# Patient Record
Sex: Female | Born: 1985 | Race: White | Hispanic: No | Smoking: Current every day smoker
Health system: Southern US, Community
[De-identification: ages and names within clinical notes are randomized; demographics above are authoritative.]

## PROBLEM LIST (undated history)

## (undated) DIAGNOSIS — I10 Essential (primary) hypertension: Secondary | ICD-10-CM

## (undated) DIAGNOSIS — R569 Unspecified convulsions: Secondary | ICD-10-CM

## (undated) DIAGNOSIS — F419 Anxiety disorder, unspecified: Secondary | ICD-10-CM

## (undated) DIAGNOSIS — F32A Depression, unspecified: Secondary | ICD-10-CM

## (undated) DIAGNOSIS — Z95818 Presence of other cardiac implants and grafts: Secondary | ICD-10-CM

## (undated) HISTORY — DX: Essential (primary) hypertension: I10

## (undated) HISTORY — PX: APPENDECTOMY: SHX54

---

## 2012-01-17 HISTORY — PX: TENDON REPAIR: SHX5111

## 2017-12-21 DIAGNOSIS — F419 Anxiety disorder, unspecified: Secondary | ICD-10-CM | POA: Insufficient documentation

## 2018-03-20 ENCOUNTER — Encounter (HOSPITAL_COMMUNITY): Payer: Self-pay | Admitting: Emergency Medicine

## 2018-03-20 ENCOUNTER — Emergency Department (HOSPITAL_COMMUNITY)
Admission: EM | Admit: 2018-03-20 | Discharge: 2018-03-21 | Disposition: A | Discharge status: Home / Self Care | Payer: BLUE CROSS/BLUE SHIELD | Attending: Emergency Medicine | Admitting: Emergency Medicine

## 2018-03-20 ENCOUNTER — Other Ambulatory Visit: Payer: Self-pay

## 2018-03-20 ENCOUNTER — Emergency Department (HOSPITAL_COMMUNITY): Payer: BLUE CROSS/BLUE SHIELD

## 2018-03-20 DIAGNOSIS — F17200 Nicotine dependence, unspecified, uncomplicated: Secondary | ICD-10-CM | POA: Insufficient documentation

## 2018-03-20 DIAGNOSIS — Y998 Other external cause status: Secondary | ICD-10-CM | POA: Diagnosis not present

## 2018-03-20 DIAGNOSIS — Y929 Unspecified place or not applicable: Secondary | ICD-10-CM | POA: Diagnosis not present

## 2018-03-20 DIAGNOSIS — S63501A Unspecified sprain of right wrist, initial encounter: Secondary | ICD-10-CM

## 2018-03-20 DIAGNOSIS — Y9389 Activity, other specified: Secondary | ICD-10-CM | POA: Diagnosis not present

## 2018-03-20 DIAGNOSIS — W228XXA Striking against or struck by other objects, initial encounter: Secondary | ICD-10-CM | POA: Diagnosis not present

## 2018-03-20 DIAGNOSIS — S6991XA Unspecified injury of right wrist, hand and finger(s), initial encounter: Secondary | ICD-10-CM | POA: Diagnosis present

## 2018-03-20 NOTE — ED Triage Notes (Signed)
Pt c/o right wrist pain after hitting it with a 2x4 yesterday. Bruising noted.

## 2018-03-21 MED ORDER — ACETAMINOPHEN 500 MG PO TABS: 1000.0000 mg | ORAL_TABLET | Freq: Three times a day (TID) | ORAL | 0 refills | Status: DC | PRN

## 2018-03-21 MED ORDER — MELOXICAM 15 MG PO TABS: 15.0000 mg | ORAL_TABLET | Freq: Every day | ORAL | 0 refills | Status: DC

## 2018-03-21 NOTE — ED Provider Notes (Signed)
[facility] EMERGENCY DEPARTMENT Provider Note   CSN: [id number] Arrival date & time: 03/20/18  2035    History   Chief Complaint Chief Complaint  Patient presents with  . Wrist Pain    HPI Jane Tran is a 33 y.o. female who presents with right wrist injury after she hit a 2 x 4 with her fist.  She reports pain on the ulnar aspect of her wrist.  She has had associated swelling.  She reports she did not have much pain when it first happened, but want to shift gears today and felt a lot of pain when she moved it.  Patient reports she has baseline numbness and tingling to her third through fifth digits on her hands that is being worked up by neurology.  This is a little worse in her pinky finger since the injury.  She did not take any medications prior to arrival.  She denies any other injuries.     HPI  History reviewed. No pertinent past medical history.  There are no active problems to display for this patient.   History reviewed. No pertinent surgical history.   OB History   No obstetric history on file.      Home Medications    Prior to Admission medications   Medication Sig Start Date End Date Taking? Authorizing Provider  acetaminophen (TYLENOL) 500 MG tablet Take 2 tablets (1,000 mg total) by mouth every 8 (eight) hours as needed for moderate pain. 03/21/18   Scarlet Abad, Waylan Boga, [name]  meloxicam (MOBIC) 15 MG tablet Take 1 tablet (15 mg total) by mouth daily. 03/21/18   [name], [name]    Family History No family history on file.  Social History Social History   Tobacco Use  . Smoking status: Current Every Day Smoker  . Smokeless tobacco: Never Used  Substance Use Topics  . Alcohol use: Yes  . Drug use: Never     Allergies   Patient has no known allergies.   Review of Systems Review of Systems  Musculoskeletal: Positive for arthralgias and joint swelling.  Neurological: Positive for numbness.     Physical Exam Updated  Vital Signs BP 118/75 (BP Location: Right Arm)   Pulse 77   Temp 98.6 F (37 C) (Oral)   Resp 18   LMP 03/20/2018   SpO2 100%   Physical Exam Vitals signs and nursing note reviewed.  Constitutional:      General: She is not in acute distress.    Appearance: She is well-developed. She is not diaphoretic.  HENT:     Head: Normocephalic and atraumatic.     Mouth/Throat:     Pharynx: No oropharyngeal exudate.  Eyes:     General: No scleral icterus.       Right eye: No discharge.        Left eye: No discharge.     Conjunctiva/sclera: Conjunctivae normal.     Pupils: Pupils are equal, round, and reactive to light.  Neck:     Musculoskeletal: Normal range of motion and neck supple.     Thyroid: No thyromegaly.  Cardiovascular:     Rate and Rhythm: Normal rate and regular rhythm.     Heart sounds: Normal heart sounds. No murmur. No friction rub. No gallop.   Pulmonary:     Effort: Pulmonary effort is normal. No respiratory distress.     Breath sounds: Normal breath sounds. No stridor. No wheezing or rales.  Abdominal:  Palpations: Abdomen is soft.  Musculoskeletal:     Comments: Right wrist: Tenderness and mild swelling over distal ulnar aspect of the wrist, no significant ecchymosis noted, no anatomical snuffbox tenderness, no tenderness to be hand or fingers; decreased sensation to the third through fifth digits, but pressure intact, cap refill less than 2 seconds, radial pulse intact  Lymphadenopathy:     Cervical: No cervical adenopathy.  Skin:    General: Skin is warm and dry.     Coloration: Skin is not pale.     Findings: No rash.  Neurological:     Mental Status: She is alert.     Coordination: Coordination normal.      ED Treatments / Results  Labs (all labs ordered are listed, but only abnormal results are displayed) Labs Reviewed - No data to display  EKG None  Radiology Dg Wrist Complete Right  Result Date: 03/20/2018 CLINICAL DATA:  Wrist injury  EXAM: RIGHT WRIST - COMPLETE 3+ VIEW COMPARISON:  None. FINDINGS: There is no evidence of fracture or dislocation. There is no evidence of arthropathy or other focal bone abnormality. Soft tissues are unremarkable. IMPRESSION: Negative. Electronically Signed   By: Jasmine Pang M.D.   On: 03/20/2018 21:36    Procedures Procedures (including critical care time)  Medications Ordered in ED Medications - No data to display   Initial Impression / Assessment and Plan / ED Course  I have reviewed the triage vital signs and the nursing notes.  Pertinent labs & imaging results that were available during my care of the patient were reviewed by me and considered in my medical decision making (see chart for details).        Patient presenting with probable sprain of the right wrist.  X-ray is negative.  There is no anatomical snuffbox tenderness.  Patient will be given Mobic, Tylenol, rest brace, and ice recommended.  Follow-up to PCP as needed.  Continue follow-up with neurology for ongoing numbness and tingling.  Return precautions discussed.  Patient understands and agrees with plan.  Patient vitals stable throughout ED course and discharged in satisfactory condition.  Final Clinical Impressions(s) / ED Diagnoses   Final diagnoses:  Sprain of right wrist, initial encounter    ED Discharge Orders         Ordered    meloxicam (MOBIC) 15 MG tablet  Daily     03/21/18 0050    acetaminophen (TYLENOL) 500 MG tablet  Every 8 hours PRN     03/21/18 0050           [name], [name] 03/21/18 0055    Dione Booze, MD 03/21/18 [phone]

## 2018-03-21 NOTE — Discharge Instructions (Signed)
Take Mobic once daily for your pain.  You can alternate with Tylenol as prescribed, as needed for your pain.  Use ice 3-4 times daily alternating 20 minutes on, 20 minutes off.  Wear brace for support, as needed.  Please follow-up with your doctor if symptoms are not improving over the next week.  Please return the emergency department if you develop any new or worsening symptoms including worsening numbness, color change, or any other concerning symptoms.

## 2018-03-21 NOTE — ED Notes (Signed)
Pt a one touch pt, see Provider's assessment.

## 2019-06-07 ENCOUNTER — Emergency Department (HOSPITAL_COMMUNITY)
Admission: EM | Admit: 2019-06-07 | Discharge: 2019-06-07 | Disposition: A | Discharge status: Home / Self Care | Payer: BLUE CROSS/BLUE SHIELD | Attending: Emergency Medicine | Admitting: Emergency Medicine

## 2019-06-07 ENCOUNTER — Other Ambulatory Visit: Payer: Self-pay

## 2019-06-07 DIAGNOSIS — M542 Cervicalgia: Secondary | ICD-10-CM | POA: Insufficient documentation

## 2019-06-07 DIAGNOSIS — Z79899 Other long term (current) drug therapy: Secondary | ICD-10-CM | POA: Insufficient documentation

## 2019-06-07 DIAGNOSIS — F17210 Nicotine dependence, cigarettes, uncomplicated: Secondary | ICD-10-CM | POA: Insufficient documentation

## 2019-06-07 MED ORDER — NAPROXEN 500 MG PO TABS: 500.0000 mg | ORAL_TABLET | Freq: Two times a day (BID) | ORAL | 0 refills | Status: DC

## 2019-06-07 MED ORDER — LIDOCAINE 5 % EX PTCH: 1.0000 | MEDICATED_PATCH | CUTANEOUS | 0 refills | Status: DC

## 2019-06-07 MED ORDER — CYCLOBENZAPRINE HCL 5 MG PO TABS: 5.0000 mg | ORAL_TABLET | Freq: Three times a day (TID) | ORAL | 0 refills | Status: DC | PRN

## 2019-06-07 NOTE — ED Notes (Signed)
Pt reports neck pain, has been seeing specialist for two years, pain is very bad tonight.

## 2019-06-07 NOTE — ED Provider Notes (Addendum)
[facility] EMERGENCY DEPARTMENT Provider Note   CSN: [id number] Arrival date & time: 06/07/19  2019     History Chief Complaint  Patient presents with  . Neck Pain    Jane Tran is a 34 y.o. female.  HPI 34 year old female with no significant medical history presents to the ER for neck pain.  Patient refers that she has been having this neck pain for over a year, has been having intermittent numbness and tingling down her arms bilaterally.  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  She notes that her muscles will begin to spasm and she has pain with lifting up her neck.  She does not note any increase in numbness and tingling, no sudden weakness.  Patient has had MRIs done approximately 2 years ago.  She does not have a neurosurgeon who she follows with here in [place].  She is thinks that she may have a bulge on the back of her neck..  Denies any fevers, chills, IVDU, chest pain, shortness of breath, syncope, dizziness.    No past medical history on file.  There are no problems to display for this patient.   No past surgical history on file.   OB History   No obstetric history on file.     No family history on file.  Social History   Tobacco Use  . Smoking status: Current Every Day Smoker  . Smokeless tobacco: Never Used  Substance Use Topics  . Alcohol use: Yes  . Drug use: Never    Home Medications Prior to Admission medications   Medication Sig Start Date End Date Taking? Authorizing Provider  acetaminophen (TYLENOL) 500 MG tablet Take 2 tablets (1,000 mg total) by mouth every 8 (eight) hours as needed for moderate pain. 03/21/18   Law, Bea Graff, [name]  cyclobenzaprine (FLEXERIL) 5 MG tablet Take 1 tablet (5 mg total) by mouth 3 (three) times daily as needed for muscle spasms. 06/07/19   Garald Balding, [name]  lidocaine (LIDODERM) 5 % Place 1  patch onto the skin daily. Remove & Discard patch within 12 hours or as directed by MD 06/07/19   Garald Balding, [name]  meloxicam (MOBIC) 15 MG tablet Take 1 tablet (15 mg total) by mouth daily. 03/21/18   Law, Bea Graff, [name]  naproxen (NAPROSYN) 500 MG tablet Take 1 tablet (500 mg total) by mouth 2 (two) times daily. 06/07/19   Garald Balding, [name]    Allergies    Penicillins and Tramadol  Review of Systems   Review of Systems  Constitutional: Negative for chills and fever.  Respiratory: Negative for shortness of breath.   Cardiovascular: Negative for chest pain.  Musculoskeletal: Positive for neck pain and neck stiffness.  Neurological: Positive for numbness. Negative for dizziness, weakness and headaches.    Physical Exam Updated Vital Signs BP 118/82 (BP Location: Left Arm)   Pulse 72   Temp 98.8 F (37.1 C) (Oral)   Resp 15   Ht 5\' 7"  (1.702 m)   Wt 59 kg   LMP 05/13/2019 (Approximate)   SpO2 100%   BMI 20.36 kg/m   Physical Exam Vitals and nursing note reviewed.  Constitutional:      General: She is not in acute distress.    Appearance: She is well-developed.  HENT:     Head: Normocephalic and atraumatic.  Eyes:  Conjunctiva/sclera: Conjunctivae normal.  Cardiovascular:     Rate and Rhythm: Normal rate and regular rhythm.     Heart sounds: No murmur.  Pulmonary:     Effort: Pulmonary effort is normal. No respiratory distress.     Breath sounds: Normal breath sounds.  Abdominal:     Palpations: Abdomen is soft.     Tenderness: There is no abdominal tenderness.  Musculoskeletal:        General: Tenderness present. No swelling, deformity or signs of injury.     Cervical back: Neck supple.     Comments: Right-sided paraspinal C-spine tenderness to palpation.  No midline tenderness in C, T, L-spine.  No noticeable step-offs, crepitus, fluctuance, evidence of disc bulge.  5/5 strength in upper extremities and neck.  Sensations intact.  Full range of motion of  neck and upper extremities.  Patient moving all 4 extremities without difficulty.  Skin:    General: Skin is warm and dry.  Neurological:     General: No focal deficit present.     Mental Status: She is alert and oriented to person, place, and time.     Sensory: No sensory deficit.     Motor: No weakness.     ED Results / Procedures / Treatments   Labs (all labs ordered are listed, but only abnormal results are displayed) Labs Reviewed - No data to display  EKG None  Radiology No results found.  Procedures Procedures (including critical care time)  Medications Ordered in ED Medications - No data to display  ED Course  I have reviewed the triage vital signs and the nursing notes.  Pertinent labs & imaging results that were available during my care of the patient were reviewed by me and considered in my medical decision making (see chart for details).    MDM Rules/Calculators/A&P                      Patient with chronic neck pain that appears to be exacerbated with the start of a new physical job. Normal neuro exam without deficits.  No midline tenderness, most of her tenderness appears to be right-sided paraspinal.  I did not appreciate any bulging, crepitus.  She is moving her neck and upper extremities without difficulty.  No increase in her numbness and tingling, no weakness.  No new trauma.  Do not think there is an indication for x-ray imaging at this time.  No fever, night sweats, weight loss, h/o cancer, IVDU.  Doubt dissection.  She will likely benefit from a repeat MRI but this can be done in an outpatient setting.  Will prescribe her Flexeril, and naproxen per patient request and lidocaine patches.  Provided her neurosurgery referral, encouraged her to make an appointment with them as soon as possible.  Patient voices understanding is agreeable to this plan.  Return precautions given.  At this stage in the ED course, the patient has been medically screened and is stable  for discharge.   Final Clinical Impression(s) / ED Diagnoses Final diagnoses:  Neck pain    Rx / DC Orders ED Discharge Orders         Ordered    cyclobenzaprine (FLEXERIL) 5 MG tablet  3 times daily PRN     06/07/19 2125    naproxen (NAPROSYN) 500 MG tablet  2 times daily     06/07/19 2125    lidocaine (LIDODERM) 5 %  Every 24 hours     06/07/19 2125  Leone Brand 06/07/19 2207    Rolan Bucco, MD 06/07/19 [phone]

## 2019-06-07 NOTE — Discharge Instructions (Signed)
Please use Flexeril for muscle spasms.  Please take mid this medication at night as it can cause drowsiness.  Do not drink or drive on this medication.  Have also prescribed you naproxen which is anti-inflammatory.  Please take this for pain.  I also prescribed you some lidocaine patches to place over your neck.  I have provided a neurosurgery referral on your discharge paperwork.  There is also a phone number on your discharge paperwork that you can call to set up a primary care doctor.  Please return to to the ER if your symptoms worsen.

## 2019-06-07 NOTE — ED Triage Notes (Signed)
Pt c/o neck pain x1 year. States bilateral arms go numb. Also states that she should have followed up with neurologist, but did not have health insurance.

## 2020-01-17 NOTE — L&D Delivery Note (Signed)
Delivery Note After a 20 minute 2nd stage, at 9:35 PM a viable female was delivered via Vaginal, Spontaneous (Presentation: Left Occiput Anterior).  APGAR: 9, 9; weight pending.  After 1 minute, the cord was clamped and cut. 40 units of pitocin diluted in 1000cc LR was infused rapidly IV.  The placenta separated spontaneously and delivered via CCT and maternal pushing effort.  It was inspected and appears to be intact with a 3 VC.  Anesthesia: Epidural Episiotomy: None Lacerations: Labial;1st degree Suture Repair: 3.0 monocryl Est. Blood Loss (mL): 592  Mom to postpartum.  Baby to Couplet care / Skin to Skin  The above by Edgardo Roys, MS, under my direct supervision.  Jane Tran 11/15/2020, 10:08 PM

## 2020-04-13 DIAGNOSIS — Z3200 Encounter for pregnancy test, result unknown: Secondary | ICD-10-CM | POA: Diagnosis not present

## 2020-04-13 DIAGNOSIS — Z3009 Encounter for other general counseling and advice on contraception: Secondary | ICD-10-CM | POA: Diagnosis not present

## 2020-04-13 IMAGING — DX DG WRIST COMPLETE 3+V*R*
4 series · 4 of 4 positions shown · non-contrast
Comparison: None.

CLINICAL DATA: Wrist injury

EXAM:
RIGHT WRIST - COMPLETE 3+ VIEW

[wrist pa]
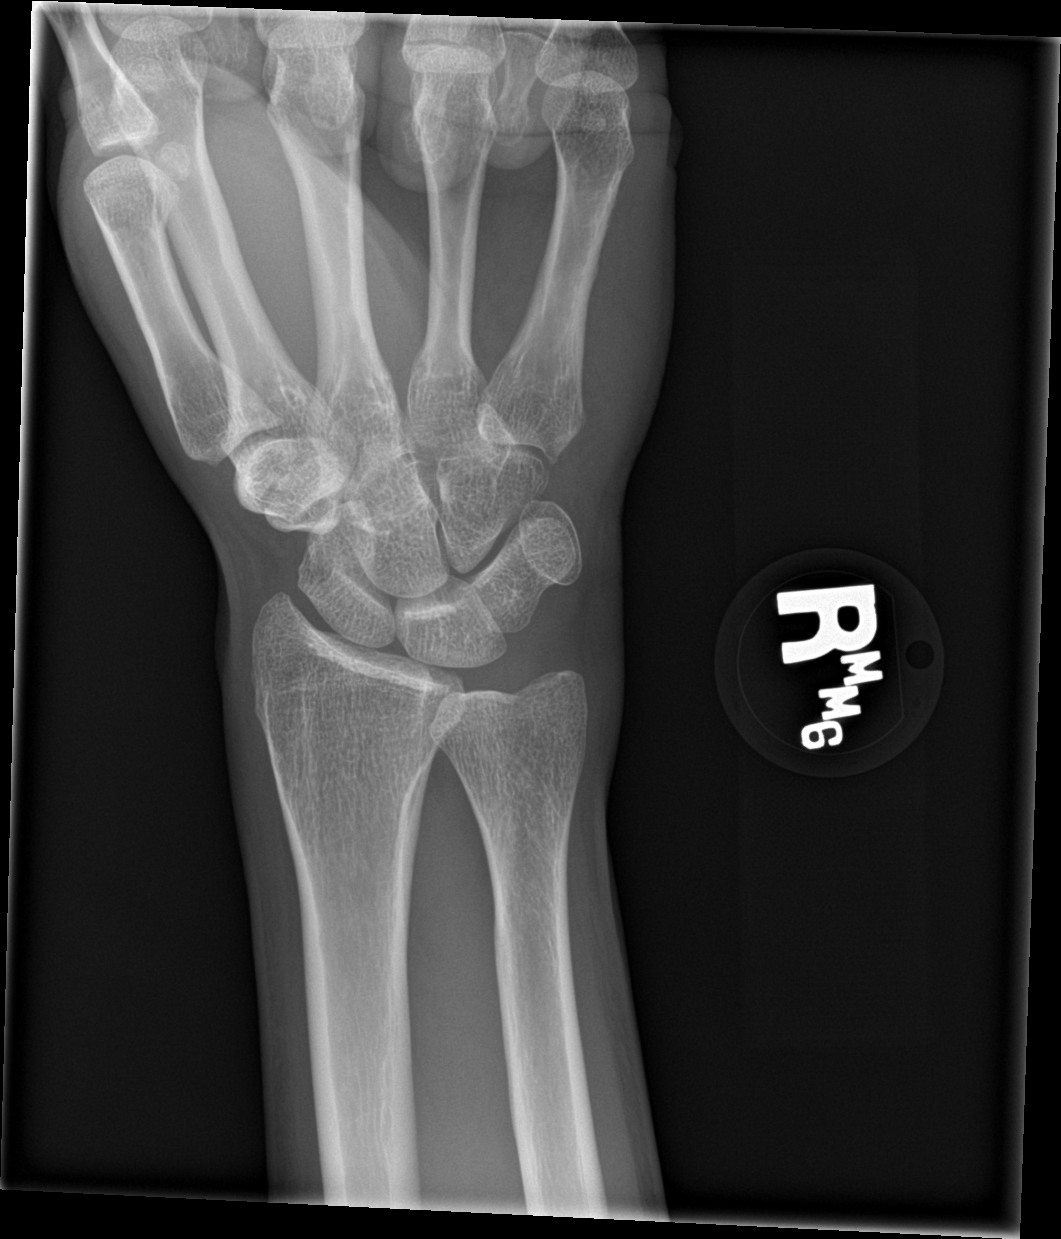

[wrist obl]
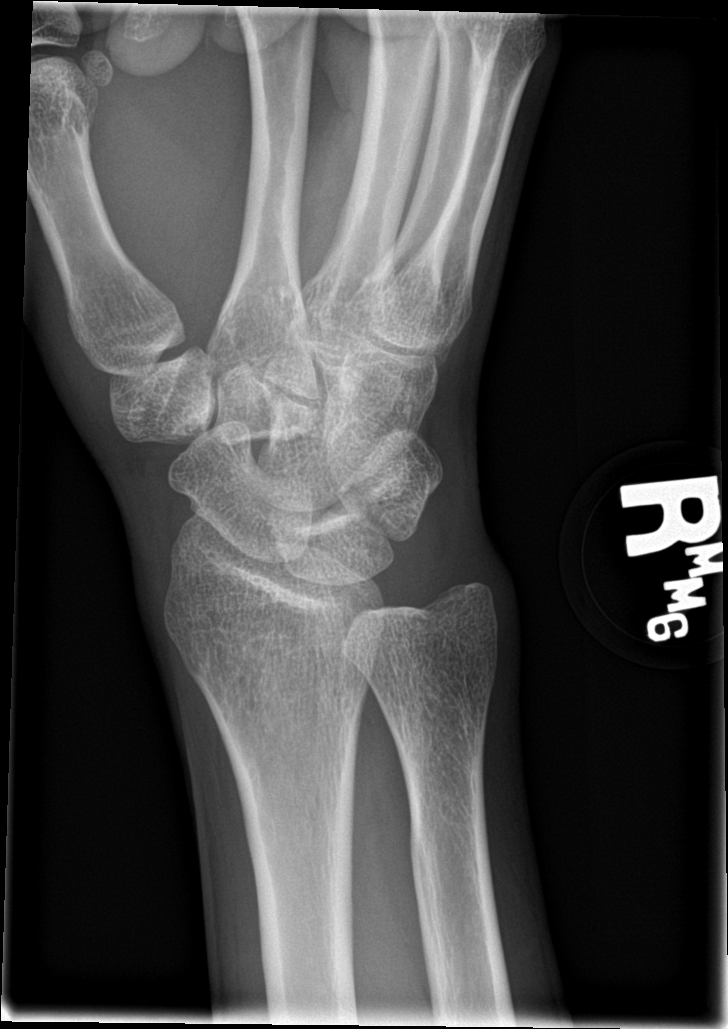

[wrist lat]
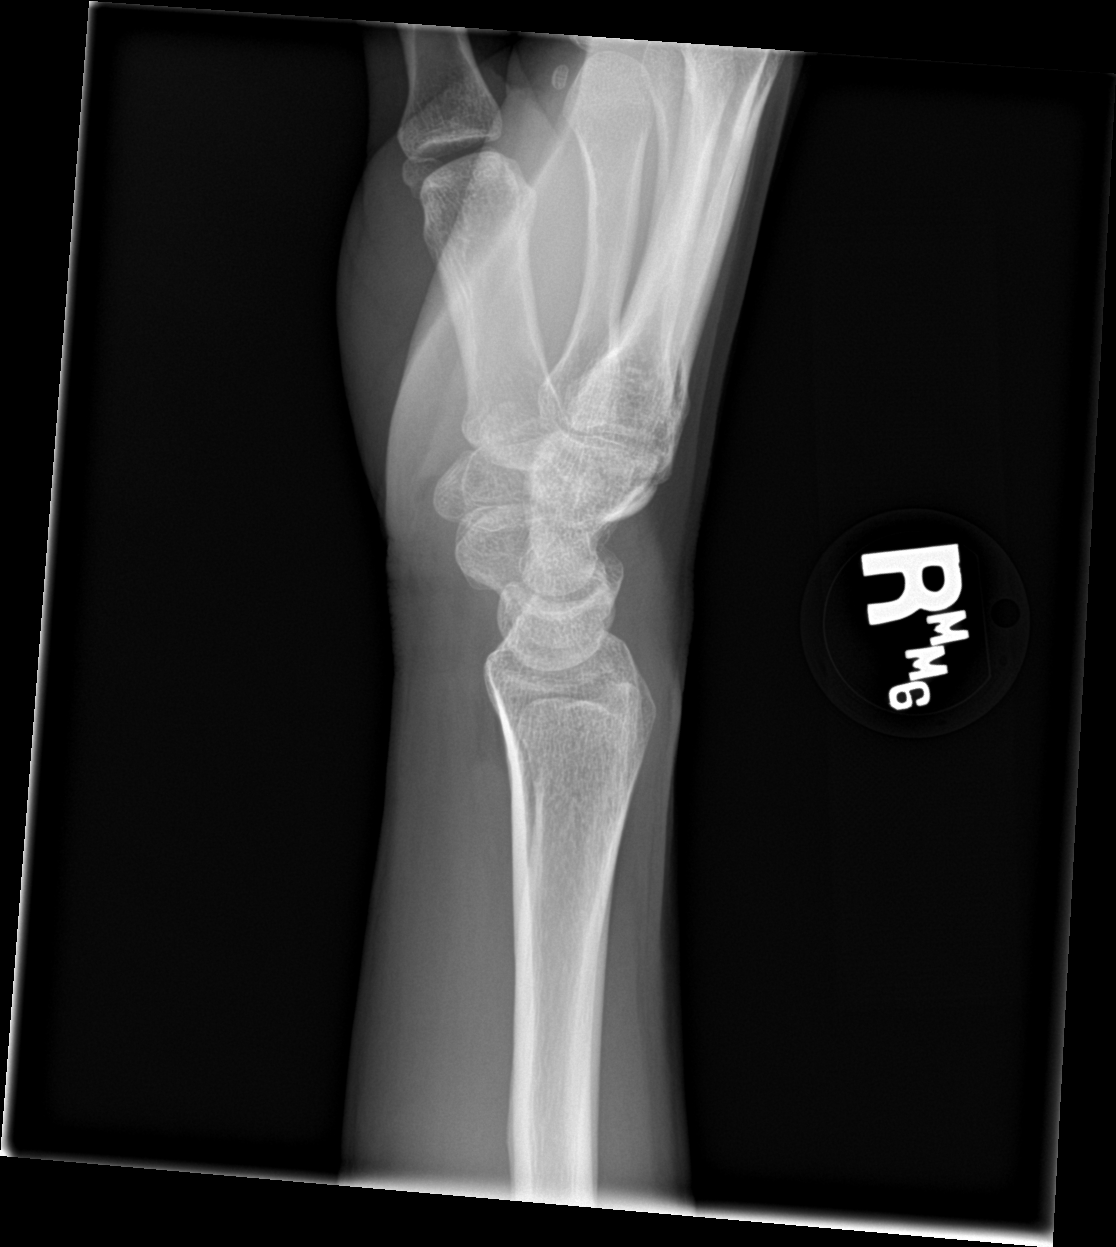

[wrist navicular]
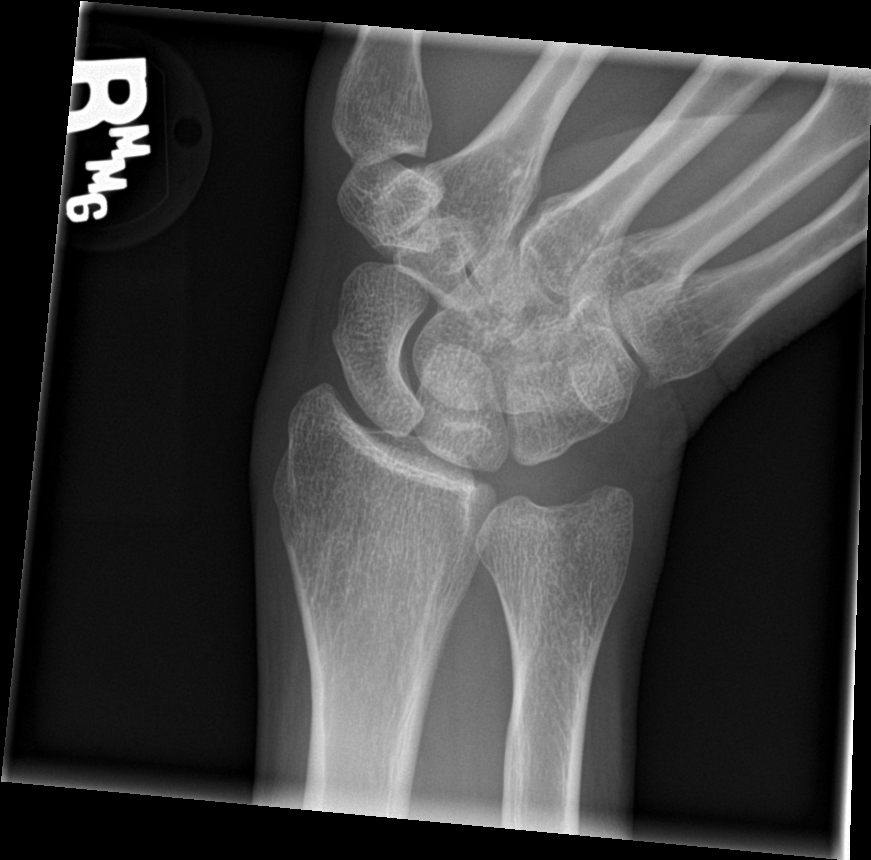

[4 of 4 positions shown; findings below may reference images not displayed]

FINDINGS: There is no evidence of fracture or dislocation. There is no
evidence of arthropathy or other focal bone abnormality. Soft
tissues are unremarkable.
IMPRESSION: Negative.

## 2020-04-21 ENCOUNTER — Inpatient Hospital Stay (HOSPITAL_COMMUNITY): Payer: Medicaid Other

## 2020-04-21 ENCOUNTER — Encounter (HOSPITAL_COMMUNITY): Payer: Self-pay | Admitting: Emergency Medicine

## 2020-04-21 ENCOUNTER — Inpatient Hospital Stay (HOSPITAL_COMMUNITY)
Admission: AD | Admit: 2020-04-21 | Discharge: 2020-04-21 | Disposition: A | Discharge status: Home / Self Care | Payer: Medicaid Other | Attending: Obstetrics & Gynecology | Admitting: Obstetrics & Gynecology

## 2020-04-21 ENCOUNTER — Other Ambulatory Visit: Payer: Self-pay

## 2020-04-21 DIAGNOSIS — O26891 Other specified pregnancy related conditions, first trimester: Secondary | ICD-10-CM | POA: Diagnosis not present

## 2020-04-21 DIAGNOSIS — F17210 Nicotine dependence, cigarettes, uncomplicated: Secondary | ICD-10-CM | POA: Diagnosis not present

## 2020-04-21 DIAGNOSIS — Z3A01 Less than 8 weeks gestation of pregnancy: Secondary | ICD-10-CM | POA: Insufficient documentation

## 2020-04-21 DIAGNOSIS — O99331 Smoking (tobacco) complicating pregnancy, first trimester: Secondary | ICD-10-CM | POA: Insufficient documentation

## 2020-04-21 DIAGNOSIS — Z880 Allergy status to penicillin: Secondary | ICD-10-CM | POA: Diagnosis not present

## 2020-04-21 DIAGNOSIS — A5901 Trichomonal vulvovaginitis: Secondary | ICD-10-CM

## 2020-04-21 DIAGNOSIS — R109 Unspecified abdominal pain: Secondary | ICD-10-CM

## 2020-04-21 DIAGNOSIS — O283 Abnormal ultrasonic finding on antenatal screening of mother: Secondary | ICD-10-CM | POA: Diagnosis not present

## 2020-04-21 DIAGNOSIS — Z3A08 8 weeks gestation of pregnancy: Secondary | ICD-10-CM | POA: Diagnosis not present

## 2020-04-21 DIAGNOSIS — O98311 Other infections with a predominantly sexual mode of transmission complicating pregnancy, first trimester: Secondary | ICD-10-CM

## 2020-04-21 DIAGNOSIS — O23591 Infection of other part of genital tract in pregnancy, first trimester: Secondary | ICD-10-CM | POA: Diagnosis not present

## 2020-04-21 DIAGNOSIS — Z3491 Encounter for supervision of normal pregnancy, unspecified, first trimester: Secondary | ICD-10-CM

## 2020-04-21 LAB — WET PREP, GENITAL
Clue Cells Wet Prep HPF POC: NONE SEEN
Sperm: NONE SEEN
Yeast Wet Prep HPF POC: NONE SEEN

## 2020-04-21 LAB — CBC
HCT: 37.6 % (ref 36.0–46.0)
Hemoglobin: 12.9 g/dL (ref 12.0–15.0)
MCH: 32.1 pg (ref 26.0–34.0)
MCHC: 34.3 g/dL (ref 30.0–36.0)
MCV: 93.5 fL (ref 80.0–100.0)
Platelets: 400 10*3/uL (ref 150–400)
RBC: 4.02 MIL/uL (ref 3.87–5.11)
RDW: 13.3 % (ref 11.5–15.5)
WBC: 17.3 10*3/uL — ABNORMAL HIGH (ref 4.0–10.5)
nRBC: 0 % (ref 0.0–0.2)

## 2020-04-21 LAB — URINALYSIS, ROUTINE W REFLEX MICROSCOPIC
Bilirubin Urine: NEGATIVE
Glucose, UA: NEGATIVE mg/dL
Hgb urine dipstick: NEGATIVE
Ketones, ur: 5 mg/dL — AB
Leukocytes,Ua: NEGATIVE
Nitrite: NEGATIVE
Protein, ur: NEGATIVE mg/dL
Specific Gravity, Urine: 1.02 (ref 1.005–1.030)
pH: 6 (ref 5.0–8.0)

## 2020-04-21 LAB — ABO/RH: ABO/RH(D): O POS

## 2020-04-21 LAB — HIV ANTIBODY (ROUTINE TESTING W REFLEX): HIV Screen 4th Generation wRfx: NONREACTIVE

## 2020-04-21 LAB — HCG, QUANTITATIVE, PREGNANCY: hCG, Beta Chain, Quant, S: 113055 m[IU]/mL — ABNORMAL HIGH (ref <5)

## 2020-04-21 MED ORDER — METRONIDAZOLE 500 MG PO TABS: ORAL_TABLET | ORAL | 0 refills | Status: DC

## 2020-04-21 NOTE — ED Provider Notes (Signed)
Emergency Medicine Provider OB Triage Evaluation Note  Jane Tran is a 35 y.o. female, G1P0, at Unknown gestation who presents to the emergency department with complaints of lower abdominal tightness  Review of  Systems  Positive: muscle aches Negative: no fever or cough  Physical Exam  LMP 05/13/2019 (Approximate)  General: Awake, no distress  HEENT: Atraumatic  Resp: Normal effort  Cardiac: Normal rate Abd: Nondistended, nontender  MSK: Moves all extremities without difficulty Neuro: Speech clear  Medical Decision Making  Pt evaluated for pregnancy concern and is stable for transfer to MAU. Pt is in agreement with plan for transfer.  5:29 PM Discussed with MAU APP, Misty Stanley, who accepts patient in transfer.  Clinical Impression  No diagnosis found.     Elson Areas, Cordelia Poche 04/21/20 1729    Gwyneth Sprout, MD 04/21/20 2303

## 2020-04-21 NOTE — Discharge Instructions (Signed)
Prenatal Care Providers           [facility]  [address] [phone]  [facility]   [address]  [phone]  [facility]       [address] [phone]            [facility]     [address] [phone]          [facility] [place]   [address] [phone]  [facility]  [address] [phone]     [facility])  [address]   [phone]     [facility]  Phone: [phone]  [facility]  Phone: [phone]  [facility] Phone: [phone]  [facility] Phone: [phone]  [facility] Phone: [phone]  [facility] Phone: [phone]  [facility]  Phone: [phone]  

## 2020-04-21 NOTE — ED Triage Notes (Signed)
Pt said she had seizures in the past year since yesterday and she had another one yesterday. Pt said he muscles are sore and feels tired. Pt said she is [redacted] weeks pregnant.

## 2020-04-21 NOTE — MAU Note (Signed)
Sent from ED for c/o lower abdominal pain and cramping since seizure yesterday.  Denies VB.

## 2020-04-21 NOTE — MAU Provider Note (Signed)
[name] is a 35 y.o. G1P0  at [redacted]w[redacted]d here with complaints of abdominal pain. She also reports a seizure yesterday. She still feels post-ictal. She denies vaginal bleeding, vaginal discharge, dysuria.  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 She has a seizure "one time a year".  Yesterday her seizure happened at 1700. She felt like she was getting disoriented and so she sat down on the floor. Her partner reports that she was "out". She gave one big "jerk" and then her partner tapped her and she "came out of it". It lasted 15 seconds. She did not hit her head or her abdomen.  History   CSN: [id number]  Arrival date and time: 04/21/20 1557  Event Date/Time  First Provider Initiated Contact with Patient 04/21/20 1828     Chief Complaint  Patient presents with  . Loss of Consciousness  . Seizures  . Cramping   Abdominal Pain  This is a new problem. The current episode started today. The onset quality is sudden. The problem occurs intermittently. The problem has been unchanged. The pain is located in the suprapubic region. The pain is at a severity of 4/10. The abdominal pain does not radiate. Associated symptoms include constipation. Pertinent negatives include no diarrhea, dysuria, nausea or vomiting. Associated symptoms comments: She is usually constipated. Relieved by: feels better when she curls up in the fetal position. She has tried nothing for the symptoms.           OB History     Gravida Para Term Preterm AB Living   1         SAB IAB Ectopic Multiple Live Births               History reviewed. No pertinent past medical history.       Past Surgical History:  Procedure Laterality Date  . TENDON REPAIR  2014        Family History  Problem Relation Age of Onset  . Diabetes Father   . Cancer Maternal Grandmother    Social History        Tobacco Use  . Smoking status: Current Every Day  Smoker    Packs/day: 0.50  . Smokeless tobacco: Never Used  Vaping Use  . Vaping Use: Never used  Substance Use Topics  . Alcohol use: Not Currently  . Drug use: Never   Allergies:      Allergies  Allergen Reactions  . Penicillins   . Tramadol           Medications Prior to Admission  Medication Sig Dispense Refill Last Dose  . acetaminophen (TYLENOL) 500 MG tablet Take 2 tablets (1,000 mg total) by mouth every 8 (eight) hours as needed for moderate pain. 30 tablet 0   . cyclobenzaprine (FLEXERIL) 5 MG tablet Take 1 tablet (5 mg total) by mouth 3 (three) times daily as needed for muscle spasms. 30 tablet 0   . lidocaine (LIDODERM) 5 % Place 1 patch onto the skin daily. Remove & Discard patch within 12 hours or as directed by MD 30 patch 0   . meloxicam (MOBIC) 15 MG tablet Take 1 tablet (15 mg total) by mouth daily. 20 tablet 0   . naproxen (NAPROSYN) 500 MG tablet Take 1 tablet (500 mg total) by mouth 2 (two) times daily. 30 tablet 0    Review of Systems  Constitutional: Negative.  HENT: Negative.  Gastrointestinal: Positive for abdominal pain and constipation. Negative for diarrhea, nausea and vomiting.  Genitourinary: Negative for dysuria.  Neurological: Positive for numbness.  Patient reports numbness in her hands that has been ongoing for three years   Physical Exam  Blood pressure 122/86, pulse 74, temperature 98.3 F (36.8 C), temperature source Oral, resp. rate 20, height 5\' 7"  (1.702 m), weight 61.4 kg, last menstrual period 02/25/2020, SpO2 97 %.  Physical Exam  MAU Course  Procedures  MDM  -will do ectopic work-up  -will do swabs  Assessment and Plan    04/24/2020 [name]  04/21/2020, 6:28 PM   MDM: Pt with normal neuro exam, episode today may have been syncope vs seizure activity.  IUP on today's 06/21/2020. Pt is positive for trichomonas, and was offered treatment in MAU. She was visibly upset and did not want to stay for treatment. Rx sent and Rx written for  Ra-mon [place], her female partner, for expedited partner therapy.  List of OB providers given and pt encouraged to start prenatal care early for her high risk pregnancy with seizure disorder.  1. Normal IUP (intrauterine pregnancy) on prenatal ultrasound, first trimester   2. Abdominal pain during pregnancy, first trimester   3. Trichomonal vaginitis during pregnancy in first trimester     Jonna Coup, CNM 9:51 PM

## 2020-04-21 NOTE — ED Notes (Signed)
Report given to MAU [facility]

## 2020-04-21 NOTE — ED Notes (Signed)
Pt transferred to MAU....moving OTF

## 2020-04-21 NOTE — MAU Provider Note (Incomplete)
[name] is a 35 y.o. G1P0  at [redacted]w[redacted]d here with complaints of abdominal pain. She also reports a seizure yesterday. She still feels post-ictal. She denies vaginal bleeding, vaginal discharge, dysuria.   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 She has a seizure "one time a year".   Yesterday her seizure happened at 1700. She felt like she was getting disoriented and so she sat down on the floor. Her partner reports that she was "out". She gave one big "jerk" and then her partner tapped her and she "came out of it". It lasted 15 seconds. She did not hit her head or her abdomen.   History     CSN: [id number]  Arrival date and time: 04/21/20 1557   Event Date/Time   First Provider Initiated Contact with Patient 04/21/20 1828      Chief Complaint  Patient presents with  . Loss of Consciousness  . Seizures  . Cramping   Abdominal Pain This is a new problem. The current episode started today. The onset quality is sudden. The problem occurs intermittently. The problem has been unchanged. The pain is located in the suprapubic region. The pain is at a severity of 4/10. The abdominal pain does not radiate. Associated symptoms include constipation. Pertinent negatives include no diarrhea, dysuria, nausea or vomiting. Associated symptoms comments: She is usually constipated. Relieved by: feels better when she curls up in the fetal position. She has tried nothing for the symptoms.    OB History    Gravida  1   Para      Term      Preterm      AB      Living        SAB      IAB      Ectopic      Multiple      Live Births              History reviewed. No pertinent past medical history.  Past Surgical History:  Procedure Laterality Date  . TENDON REPAIR  2014    Family History  Problem Relation Age of Onset  . Diabetes Father   . Cancer Maternal Grandmother     Social History    Tobacco Use  . Smoking status: Current Every Day Smoker    Packs/day: 0.50  . Smokeless tobacco: Never Used  Vaping Use  . Vaping Use: Never used  Substance Use Topics  . Alcohol use: Not Currently  . Drug use: Never    Allergies:  Allergies  Allergen Reactions  . Penicillins   . Tramadol     Medications Prior to Admission  Medication Sig Dispense Refill Last Dose  . acetaminophen (TYLENOL) 500 MG tablet Take 2 tablets (1,000 mg total) by mouth every 8 (eight) hours as needed for moderate pain. 30 tablet 0   . cyclobenzaprine (FLEXERIL) 5 MG tablet Take 1 tablet (5 mg total) by mouth 3 (three) times daily as needed for muscle spasms. 30 tablet 0   . lidocaine (LIDODERM) 5 % Place 1 patch onto the skin daily. Remove & Discard patch within 12 hours or as directed by MD 30 patch 0   . meloxicam (MOBIC) 15 MG tablet Take 1 tablet (15 mg total) by mouth daily. 20 tablet 0   . naproxen (NAPROSYN) 500 MG tablet Take 1 tablet (500 mg total) by mouth 2 (two) times daily.  30 tablet 0     Review of Systems  Constitutional: Negative.   HENT: Negative.   Gastrointestinal: Positive for abdominal pain and constipation. Negative for diarrhea, nausea and vomiting.  Genitourinary: Negative for dysuria.  Neurological: Positive for numbness.       Patient reports numbness in her hands that has been ongoing for three years   Physical Exam   Blood pressure 122/86, pulse 74, temperature 98.3 F (36.8 C), temperature source Oral, resp. rate 20, height 5\' 7"  (1.702 m), weight 61.4 kg, last menstrual period 02/25/2020, SpO2 97 %.  Physical Exam  MAU Course  Procedures  MDM -will do ectopic work-up -will do swabs  Assessment and Plan  ***  04/24/2020 [facility] 04/21/2020, 6:28 PM

## 2020-04-22 DIAGNOSIS — R569 Unspecified convulsions: Secondary | ICD-10-CM | POA: Insufficient documentation

## 2020-04-22 LAB — GC/CHLAMYDIA PROBE AMP (~~LOC~~) NOT AT ARMC
Chlamydia: NEGATIVE
Comment: NEGATIVE
Comment: NORMAL
Neisseria Gonorrhea: NEGATIVE

## 2020-04-29 DIAGNOSIS — R55 Syncope and collapse: Secondary | ICD-10-CM | POA: Diagnosis not present

## 2020-04-29 DIAGNOSIS — Z3A09 9 weeks gestation of pregnancy: Secondary | ICD-10-CM | POA: Diagnosis not present

## 2020-05-11 ENCOUNTER — Telehealth (INDEPENDENT_AMBULATORY_CARE_PROVIDER_SITE_OTHER): Payer: Medicaid Other

## 2020-05-11 DIAGNOSIS — O0990 Supervision of high risk pregnancy, unspecified, unspecified trimester: Secondary | ICD-10-CM | POA: Insufficient documentation

## 2020-05-11 DIAGNOSIS — O09521 Supervision of elderly multigravida, first trimester: Secondary | ICD-10-CM

## 2020-05-11 DIAGNOSIS — F419 Anxiety disorder, unspecified: Secondary | ICD-10-CM | POA: Insufficient documentation

## 2020-05-11 DIAGNOSIS — O09529 Supervision of elderly multigravida, unspecified trimester: Secondary | ICD-10-CM | POA: Insufficient documentation

## 2020-05-11 DIAGNOSIS — Z136 Encounter for screening for cardiovascular disorders: Secondary | ICD-10-CM

## 2020-05-11 MED ORDER — BLOOD PRESSURE MONITORING DEVI: 1.0000 | 0 refills | Status: DC

## 2020-05-11 NOTE — Patient Instructions (Signed)
AREA PEDIATRIC/FAMILY PRACTICE PHYSICIANS  [place] ([address]) . [facility] o Chambliss, MD; Eniola, MD; Hale, MD; Hensel, MD; McDiarmid, MD; McIntyer, MD; Elder Davidian, MD; Walden, MD o [address] [place], [place] [address] o [phone] o Mon-Fri 8:30-12:30, 1:30-5:00 o Providers come to see babies at [facility] o Accepting Medicaid . [facility] o Limited providers who accept newborns: Koirala, MD; Morrow, MD; Wolters, MD o [address], [place], [place] [address] o [phone] o Mon-Fri 8:00-5:30 o Babies seen by providers at [facility] o Does NOT accept Medicaid o Please call early in hospitalization for appointment (limited availability)  . [facility] o Mulberry, MD o [address] [place], [place] [address] o [phone] o Mon, Tue, Thur, Fri 8:30-5:00, Wed 10:00-7:00 (closed 1-2pm) o Babies seen by [facility] providers o Accepting Medicaid . [facility] o Rubin, MD o [address], [place], [place] [address] o [phone] o Mon-Fri 8:30-5:00, Sat 8:30-12:00 o Provider comes to see babies at [facility] o Accepting Medicaid o Must have been referred from current patients or contacted office prior to delivery . [facility] ([facility]) o Brown, MD; Chandler, MD; Ettefagh, MD; Grant, MD; Lester, MD; McCormick, MD; McQueen, MD; Prose, MD; Simha, MD; Stanley, MD; Stryffeler, NP; Tebben, NP o [address], [place], [place] [address] o [phone] o Mon, Tue, Thur, Fri 8:30-5:30, Wed 9:30-5:30, Sat 8:30-12:30 o Babies seen by [facility] providers o Accepting Medicaid o Only accepting infants of first-time parents or siblings of current patients o Hospital discharge coordinator will make follow-up appointment . Jack Amos o [address],  [place], [place]  [address] o [phone]   Fax - [phone] . [facility] o [address], [place], [place]  [address] o Phone - [phone]   Fax - [phone] . Shilpa Gosrani o [address], [place], [place]  [address] o [phone]  [place] ([address]) . [facility] o Bates, MD; [place]field, MD; Cooper, Cox, MD; MD; Davis, MD; Dovico, MD; Ettefaugh, MD; Little, MD; Lowe, MD; Keiffer, MD; Melvin, MD; Sumner, MD; Williams, MD o [address], [place], [place] [address] o [phone] o Mon-Fri 8:30-5:00 (extended evenings Mon-Thur as needed), Sat-Sun 10:00-1:00 o Providers come to see babies at [facility] o Accepting Medicaid for families of first-time babies and families with all children in the household age 3 and under. Must register with office prior to making appointment (M-F only). . [facility] o Henson, NP; Knapp, MD; Lalonde, MD; Tysinger, PA o [address] [place], [place] [address] o [phone] o Mon-Fri 8:00-5:00 o Babies seen by providers at [facility] o Does NOT accept Medicaid/Commercial Insurance Only . [facility] ([facility])  o Artis, MD; Barnes, MD; Bratton, MD; Coccaro, MD; Lockett Gardner, MD; Kramer, MD; Marshall, MD; Netherton, MD; Poleto, MD; Skinner, MD o [address] [place], [place] [address] o [phone] o Mon-Fri 8:30-5:30, Sat (Oct.-Mar.) 9:00-1:00 o Babies seen by providers at [facility] o Accepting Medicaid  [place] ([address]) . [facility] o Reid, MD; Warner, MD o [address], , [place] [address] o [phone] o Mon-Fri 8:30-5:00, Sat 8:30-12:00 o Providers come to see babies at [facility] o Does NOT accept Medicaid . [facility] o Becker, PA; Hagler, MD; Scifres, PA; Sun, MD; Swayne, MD o [address],  [place], [place] [address] o [phone] o Mon-Fri 8:00-5:00 o Babies seen by providers at [facility] o Does NOT accept Medicaid o Only accepting babies of parents who are patients o Please call early in hospitalization for appointment (limited availability) . [facility] o Clark, MD; Frye, MD; Kelleher, MD; Mack, NP; Miller, MD; O'Keller, MD; Patterson, NP; Pudlo, MD; Puzio, MD; Thomas, MD; Tucker, MD; Twiselton, MD o [address], [place], [place] [address] o [phone] o Mon-Fri 8:00-5:00, Sat 9:00-12:00 o Providers come to see babies at [facility] o Does NOT accept Medicaid  [place] ([address]) . [facility] o Limited providers accepting new patients: Brake, NP; Wharton, PA o [address], [place], [place] [address] o [phone] o Mon-Fri 8:00-5:00 o Babies seen by providers at [facility] o Does NOT accept Medicaid o Only accepting babies of parents who are patients o Please call early in hospitalization for appointment (limited availability) . [facility] o Gay, MD; Quinlan, MD o [address] [place], [place] [address] o [phone] (press [phone] to schedule appointment) o Mon-Fri 8:00-5:00 o Providers come to see babies at [facility] o Does NOT accept Medicaid . [facility] o Mazer, MD o [address] [place], [place] [address] o [phone] o Mon-Fri 8:30-5:00 (lunch 12:30-1:00), extended hours by appointment only Wed 5:00-6:30 o Babies seen by [facility] providers o Accepting Medicaid . [facility] o Banks, MD; Jordan, MD; Koberlein, MD o [address], [place], [place] [address] o [phone] o Mon-Fri 8:00-5:00 o Babies seen by [facility] providers o Does NOT accept Medicaid . [facility] o Parker, MD; Hunter, MD; Wallace, DO o [address] [place], [place]  [address] o [phone] o Mon-Fri 8:00-5:00 o Babies seen by [facility] providers o Does NOT accept Medicaid . [facility] o Brandon, PA; Brecken, PA; Christy, NP; Dees, MD; DeClaire, MD; DeWeese, MD; Hansen, NP; Mills, NP; Parrish, NP; Smoot, NP; Summer, MD; Vapne, MD o [address] [place], [place] [address] o [phone] o Mon-Fri 8:30-5:00, Sat 10:00-1:00 o Providers come to see babies at [facility] o Does NOT accept Medicaid o Free prenatal information session Tuesdays at 4:45pm . [facility] o Bouska, MD; Gordon, PA; Jeffery, PA; Weber, PA o [address] [place] [address] o [phone] o Mon-Fri 7:30-5:30 o Babies seen by [facility] providers . [facility] o [address], [place], [place]  [address] o [phone]   Fax - [phone]  [place] ([address] & [address]) . [facility] o [place], [place] o [address] [place], [place] [address] o [phone] o Mon-Thur 8:00-6:00 o Providers come to see babies at [facility] o Accepting Medicaid . [facility] o Anderson, NP; Badger, MD; Beal, PA; Spencer, PA o [address] [place], [place] [address] o [phone] o Mon-Thur 7:30-7:30, Fri 7:30-4:30 o Babies seen by [facility] providers o Accepting Medicaid . [facility] o Agbuya, MD; Klett, NP; Romgoolam, MD o [address], [place], [place] [address] o [phone] o Mon-Fri 8:30-5:00, Sat 8:30-12:00 o Providers come to see babies at [facility] o Accepting Medicaid o Must have "Meet & Greet" appointment at office prior to delivery . [facility] o McCord,   MD; Wallace, MD; Wood, MD o [address], [place], [place] [address] o [phone] o Mon-Wed 8:00-6:00, Thur-Fri 8:00-5:00, Sat 9:00-12:00 o Providers come to  see babies at [facility] o Does NOT accept Medicaid o Only accepting siblings of current patients . [facility]  o [address], [place], [place]  [address] o [phone]   Fax - [phone] . [facility] o [address], [place], [place]  [address] o [phone]   Fax - [phone]  [place] ([address]) . [facility] o Cirigliano, DO; Matthews, DO o [address] , [place] [address] o [phone] o Mon-Fri 7:00-5:00 o Babies seen by [facility] providers o Does NOT accept Medicaid . [facility] o Briscoe, MD; Howley, PA; Moreira, PA o [address], [place], [place] [address] o [phone] o Mon-Fri 8:00-5:00 o Babies seen by [facility] providers o Accepting Medicaid . [facility] o Boyd, MD; Church, PA; Jones, NP; Osborn, PA o [address], , [place] [address] o [phone] o Mon-Fri 8:00-5:00 o Babies seen by providers at [facility] o Accepting Medicaid  [place] ([address]) . [facility] [place] o Wendling, DO o [address] [place], [place] [address] o [phone] o Mon-Fri 8:00-5:00 o Babies seen by [facility] providers o Does NOT accept Medicaid o Limited availability, please call early in hospitalization to schedule follow-up . [facility] o Calderon, PA; Cummings, MD; Dillard, MD; Martin, PA; Olson, MD; VanDeven, PA o [address], [place], [place] [address] o [phone] o Mon-Fri 8:30-5:00, Sat 9:00-12:00 o Babies seen by providers at [facility] o Accepting Medicaid o Please register online then schedule online or call office o [email or web address] . [facility] o Hunter, NP; Kumar, MD; Martin Rogers, PA o [address], [place], [place] [address] o [phone] o Mon-Fri 8:00-5:00 o Babies seen by providers at [facility] o Accepting Medicaid . [facility] ([facility] o Mulberry, MD; Kristi Fleenor, NP; West, MD o [address], [place], [place] [address] o [phone] o Mon-Fri 8:00-5:30, Sat&Sun by appointment (phones open at 8:30) o Babies seen by [facility] providers o Accepting Medicaid o Must be a first-time baby or sibling of current patient . [facility] [place]  o [address], [place], [place]  [address] o [phone]   Fax - [phone]  [place] ([address]) . [facility] o Brown, PA; Cowen, PA; Rice, MD; Helton, PA; Spry, MD o [address] [place], [place] [address] o [phone] o Mon-Thur 8:00-7:00, Fri 8:00-5:00, Sat 8:00-12:00, Sun 9:00-12:00 o Babies seen by [facility] providers o Accepting Medicaid . [facility] o Coe-Goins, MD; Marshall, MD; Pierre-Louis, MD o [address] [place] [address], [place], [place] [address] o [phone] o Mon-Thur 8:00-5:00 o Babies seen by providers at [facility] o Accepting Medicaid . [facility] o Bratton, MD; Coe-Goins, MD; Hayes, MD; Lewis, MD; List, MD; Lott, MD; Marshall, MD; Moran, MD; O'Rip Hawes, MD; Pierre-Louis, MD; Pitonzo, MD; Scholer, MD; Spangle, MD o [address] [place], [place]   [address] o [phone] o Mon-Fri 8:00-5:30, Sat (Oct.-Mar.) 9:00-1:00 o Babies seen by providers at [facility] o Accepting Medicaid o Must fill out new patient packet, available online at [email or web address] . [facility] ([facility] o Friddle, NP; Harris, NP; Kelly, NP; Logan, MD;  Melvin, PA; Poth, MD; Ramadoss, MD; Stanton, NP o [address], [place], [place] [address] o [phone] o Mon-Thur 8:00-5:30, Fri 8:00-5:00 o Babies seen by providers at [facility] o Accepting Medicaid  [place] ([address]) . [facility] o Dixon, PA; Valeria, MD; Pickard, MD; Tapia, PA o [address], [place], [place] [address] o [phone] o Mon-Fri 8:00-5:00 o Babies seen by providers at [facility] o Accepting Medicaid   [place] ([address]) . [facility] [place] o Masneri, DO; Meyers, MD; Nelson, PA o [address], [place], [place] [address] o [phone] o Mon-Fri 8:00-5:00 o Babies seen by providers at [facility] o Does NOT accept Medicaid o Limited appointment availability, please call early in hospitalization  . [facility] [place] o Kunedd, DO; McGowen, MD o [address], [place], [place] [address] o [phone] o Mon-Fri 8:00-5:00 o Babies seen by [facility] providers o Does NOT accept Medicaid . [facility] [place] o Cameron, MD; MacDonald, MD; Michaels, PA; Nayak, MD o [address] [place] [address], [place], [place] [address] o [phone] o Mon-Fri 8:00-5:00 o After hours clinic ([address] [place], [place] [address]) [phone] Mon-Fri 5:00-8:00, Sat 12:00-6:00, Sun 10:00-4:00 o Babies seen by [facility] providers o Accepting Medicaid . [facility] [place] o [address], [place], [place]  [address] o [phone]   Fax - [phone]  [place] ([address]) . [facility] [place] o Andy, MD o [address], [place], [place] [address] o [phone] o Mon-Fri 8:00-5:00 o Babies seen by [facility] providers o Does NOT accept Medicaid . [facility] [place] ([facility] [place]) o Eksir, MD o [address], [place], [place]  [address] o [phone] o Mon-Thur 8:00-7:00, Fri 8:00-5:00, Sat 8:00-12:00 o Babies seen by providers at [facility] o Accepting Medicaid - but does not have vaccinations in office (must be received elsewhere) o Limited availability, please call early in hospitalization  [place] ([address]) . [facility]  o Charlene Flemming, MD o [address], [place] [address] o [phone]  Fax [phone]   

## 2020-05-11 NOTE — Progress Notes (Signed)
New OB Intake  I connected with  Jane Tran on 05/11/20 at 11:15 AM EDT by MyChart and verified that I am speaking with the correct person using two identifiers. Nurse is located at [facility] and pt is located at home.  I discussed the limitations, risks, security and privacy concerns of performing an evaluation and management service by telephone and the availability of in person appointments. I also discussed with the patient that there may be a patient responsible charge related to this service. The patient expressed understanding and agreed to proceed.  I explained I am completing New OB Intake today. We discussed her EDD of 11/26/20 that is based on U/S  On 04/21/20. Pt is G 1/P 0. I reviewed her allergies, medications, Medical/Surgical/OB history, and appropriate screenings. I informed her of [facility] services. Based on history, this is a/an complicated by hypertension pregnancy.  There are no problems to display for this patient.   Concerns addressed today  Delivery Plans:  Plans to deliver at [facility].   MyChart/Babyscripts MyChart access verified. I explained pt will have some visits in office and some virtually. Babyscripts instructions given. Account successfully created and app downloaded.  Blood Pressure Cuff Blood pressure cuff ordered for patient to pick-up from [employer]. Explained after first prenatal appt pt will check weekly and document in Babyscripts.  Anatomy US Explained first scheduled [place] will be around 19 weeks. Anatomy US scheduled for 07/02/20 at 12:45P. Pt notified to arrive at 12:30P.  Labs Discussed Avelina Laine genetic screening with patient. Would like both Panorama and Horizon drawn at new OB visit. Routine prenatal labs needed.  Covid Vaccine Patient has not covid vaccine.   [facility] Referral Patient is interested in referral to [facility].   Pregnancy Navigator Informed patient a pregnancy navigator will see her at her first ob visit with provider.   [place]  appointments Childcare: Informed patient when she has Ultrasound appointments she will not be able to bring child/ children with her to Ultrasound appointments. Asked if childcare would be an issue.   First visit review I reviewed new OB appt with pt. I explained she will have a pelvic exam, ob bloodwork with genetic screening, and PAP smear. Explained pt will be seen by Dr.Ervin at first visit; encounter routed to appropriate provider. If new patient offered monthly Zoom meeting  Jane Tran, [facility] 05/11/2020  11:38 AM

## 2020-05-14 DIAGNOSIS — O99350 Diseases of the nervous system complicating pregnancy, unspecified trimester: Secondary | ICD-10-CM | POA: Diagnosis not present

## 2020-05-17 DIAGNOSIS — Z3A12 12 weeks gestation of pregnancy: Secondary | ICD-10-CM | POA: Diagnosis not present

## 2020-05-17 DIAGNOSIS — R55 Syncope and collapse: Secondary | ICD-10-CM | POA: Diagnosis not present

## 2020-05-17 DIAGNOSIS — R930 Abnormal findings on diagnostic imaging of skull and head, not elsewhere classified: Secondary | ICD-10-CM | POA: Diagnosis not present

## 2020-05-18 DIAGNOSIS — R002 Palpitations: Secondary | ICD-10-CM | POA: Diagnosis not present

## 2020-05-18 DIAGNOSIS — R0789 Other chest pain: Secondary | ICD-10-CM | POA: Diagnosis not present

## 2020-05-18 DIAGNOSIS — R55 Syncope and collapse: Secondary | ICD-10-CM | POA: Diagnosis not present

## 2020-05-18 DIAGNOSIS — Z136 Encounter for screening for cardiovascular disorders: Secondary | ICD-10-CM | POA: Diagnosis not present

## 2020-05-20 ENCOUNTER — Ambulatory Visit (INDEPENDENT_AMBULATORY_CARE_PROVIDER_SITE_OTHER): Payer: Medicaid Other | Admitting: Obstetrics and Gynecology

## 2020-05-20 ENCOUNTER — Other Ambulatory Visit: Payer: Self-pay

## 2020-05-20 ENCOUNTER — Encounter: Payer: Self-pay | Admitting: Obstetrics and Gynecology

## 2020-05-20 ENCOUNTER — Other Ambulatory Visit (HOSPITAL_COMMUNITY)
Admission: RE | Admit: 2020-05-20 | Discharge: 2020-05-20 | Disposition: A | Discharge status: Home / Self Care | Payer: Medicaid Other | Source: Ambulatory Visit | Attending: Obstetrics and Gynecology | Admitting: Obstetrics and Gynecology

## 2020-05-20 VITALS — BP 112/67 | HR 80 | Wt 135.0 lb

## 2020-05-20 DIAGNOSIS — A599 Trichomoniasis, unspecified: Secondary | ICD-10-CM | POA: Insufficient documentation

## 2020-05-20 DIAGNOSIS — R9089 Other abnormal findings on diagnostic imaging of central nervous system: Secondary | ICD-10-CM

## 2020-05-20 DIAGNOSIS — Z31430 Encounter of female for testing for genetic disease carrier status for procreative management: Secondary | ICD-10-CM | POA: Diagnosis not present

## 2020-05-20 DIAGNOSIS — O09521 Supervision of elderly multigravida, first trimester: Secondary | ICD-10-CM

## 2020-05-20 DIAGNOSIS — Z3481 Encounter for supervision of other normal pregnancy, first trimester: Secondary | ICD-10-CM | POA: Diagnosis not present

## 2020-05-20 DIAGNOSIS — Z5941 Food insecurity: Secondary | ICD-10-CM

## 2020-05-20 DIAGNOSIS — O0990 Supervision of high risk pregnancy, unspecified, unspecified trimester: Secondary | ICD-10-CM

## 2020-05-20 DIAGNOSIS — F419 Anxiety disorder, unspecified: Secondary | ICD-10-CM

## 2020-05-20 DIAGNOSIS — R55 Syncope and collapse: Secondary | ICD-10-CM

## 2020-05-20 LAB — POCT URINALYSIS DIP (DEVICE)
Bilirubin Urine: NEGATIVE
Glucose, UA: NEGATIVE mg/dL
Ketones, ur: NEGATIVE mg/dL
Leukocytes,Ua: NEGATIVE
Nitrite: NEGATIVE
Protein, ur: NEGATIVE mg/dL
Specific Gravity, Urine: 1.025 (ref 1.005–1.030)
Urobilinogen, UA: 0.2 mg/dL (ref 0.0–1.0)
pH: 6 (ref 5.0–8.0)

## 2020-05-20 MED ORDER — ASPIRIN EC 81 MG PO TBEC: 81.0000 mg | DELAYED_RELEASE_TABLET | Freq: Every day | ORAL | 2 refills | Status: DC

## 2020-05-20 NOTE — Patient Instructions (Addendum)
DOULA LIST   [facility] Doula  [place]  [phone]  [email or web address]  [email or web address]  [place] [employer] [phone]  [email or web address]   [employer]   Precious Danford Bad   [email or web address]   ??THE MOTHERLY DOULA?? Zola Button   [phone]   [email or web address]     [facility] Doula  Olive Bass  [phone]    [email or web address]   [facility]  Angie Rosier     [phone]     [email or web address]   Renato Gails: Doula & Photographer   Renato Gails [phone]       [email or web address]  [email or web address]   [employer] [facility]oula [facility]  [place] [phone]   [email or web address]   [place]  Lolita Rieger  [phone]  [email or web address]   [email or web address]   [facility]   Kizzie Furnish   [phone]  [email or web address]   [facility] [place] Doula  Dina Rich  [phone] [email or web address]  [facility] Doul[facility]  Cornelious Bryant         [facility]       Lora Reynolds     [phone] [email or web address]  [email or web address]   [facility]   [place] [phone] [email or web address]   [facility] Doula [facility]  Camelia Eng     [phone]  [email or web address]  [facility]     [phone]  [email or web address]  Facebook and IG [email or web address] .  [phone] [phone] [email or web address]   [phone]  [phone] [email or web address]     [facility]  [phone] [email or web address]   IG [email or web address]    [phone]    [phone]  [email or web address]   Lurline Hare "[place]" Free  [phone] [email or web address]    Mtende Roll  [phone] [email or web address]   Susie Williams   [email or web address]    [phone]    [phone] [email or web address]     Denita Lung  [phone] [email or web address] Galloway  [phone] [email or web address]    Prichard Rhem    [phone]   [facility] Red wing  [phone] [email or web address]  Doula Mama [phone] [email or web address]  [facility] Maryjean Ka  [phone] [email or web address]  [facility] [phone]  [email or web address]  [email or web address] Montgomery Harris-Jones  [phone] [email or web address] .[email or web address]   Allen Kell [phone] [email or web address] .Ardine Eng   [phone] [email or web address]   Gilda Crease  [phone] [email or web address]  [employer]  Jonelle Sports  [phone] [email or web address]   Jackelyn Poling [phone] [email or web address]  MOOR Divine Myeka Dunn  [email or web address]   Cristina Gong [phone] [email or web address]   Glory Buff [phone] [email or web address]   Whitney Muse [phone] [email or web address]         Obstetrics: Normal and Problem Pregnancies (7th ed., pp. 102-121). [place], [place]: Elsevier."> Textbook of Family Medicine (9th ed., pp. [phone]). [place]: Elsevier Saunders.">  First Trimester of Pregnancy  The first trimester of pregnancy starts on the first day of your last menstrual period until the end of week 12. This is months 1 through 3 of pregnancy. A week after a sperm fertilizes an egg, the egg will implant into the wall of the uterus and begin to develop into a baby. By the end of 12 weeks, all the baby's organs will be formed and the  baby will be 2-3 inches in size. Body changes during your first trimester Your body  goes through many changes during pregnancy. The changes vary and generally return to normal after your baby is born. Physical changes  You may gain or lose weight.  Your breasts may begin to grow larger and become tender. The tissue that surrounds your nipples (areola) may become darker.  Dark spots or blotches (chloasma or mask of pregnancy) may develop on your face.  You may have changes in your hair. These can include thickening or thinning of your hair or changes in texture. Health changes  You may feel nauseous, and you may vomit.  You may have heartburn.  You may develop headaches.  You may develop constipation.  Your gums may bleed and may be sensitive to brushing and flossing. Other changes  You may tire easily.  You may urinate more often.  Your menstrual periods will stop.  You may have a loss of appetite.  You may develop cravings for certain kinds of food.  You may have changes in your emotions from day to day.  You may have more vivid and strange dreams. Follow these instructions at home: Medicines  Follow your health care provider's instructions regarding medicine use. Specific medicines may be either safe or unsafe to take during pregnancy. Do not take any medicines unless told to by your health care provider.  Take a prenatal vitamin that contains at least 600 micrograms (mcg) of folic acid. Eating and drinking  Eat a healthy diet that includes fresh fruits and vegetables, whole grains, good sources of protein such as meat, eggs, or tofu, and low-fat dairy products.  Avoid raw meat and unpasteurized juice, milk, and cheese. These carry germs that can harm you and your baby.  If you feel nauseous or you vomit: ? Eat 4 or 5 small meals a day instead of 3 large meals. ? Try eating a few soda crackers. ? Drink liquids between meals instead of during meals.  You may  need to take these actions to prevent or treat constipation: ? Drink enough fluid to keep your urine pale yellow. ? Eat foods that are high in fiber, such as beans, whole grains, and fresh fruits and vegetables. ? Limit foods that are high in fat and processed sugars, such as fried or sweet foods. Activity  Exercise only as directed by your health care provider. Most people can continue their usual exercise routine during pregnancy. Try to exercise for 30 minutes at least 5 days a week.  Stop exercising if you develop pain or cramping in the lower abdomen or lower back.  Avoid exercising if it is very hot or humid or if you are at high altitude.  Avoid heavy lifting.  If you choose to, you may have sex unless your health care provider tells you not to. Relieving pain and discomfort  Wear a good support bra to relieve breast tenderness.  Rest with your legs elevated if you have leg cramps or low back pain.  If you develop bulging veins (varicose veins) in your legs: ? Wear support hose as told by your health care provider. ? Elevate your feet for 15 minutes, 3-4 times a day. ? Limit salt in your diet. Safety  Wear your seat belt at all times when driving or riding in a car.  Talk with your health care provider if someone is verbally or physically abusive to you.  Talk with your health care provider if you are feeling sad or have thoughts of hurting yourself. Lifestyle  Do not use hot tubs, steam rooms,  or saunas.  Do not douche. Do not use tampons or scented sanitary pads.  Do not use herbal remedies, alcohol, illegal drugs, or medicines that are not approved by your health care provider. Chemicals in these products can harm your baby.  Do not use any products that contain nicotine or tobacco, such as cigarettes, e-cigarettes, and chewing tobacco. If you need help quitting, ask your health care provider.  Avoid cat litter boxes and soil used by cats. These carry germs that  can cause birth defects in the baby and possibly loss of the unborn baby (fetus) by miscarriage or stillbirth. General instructions  During routine prenatal visits in the first trimester, your health care provider will do a physical exam, perform necessary tests, and ask you how things are going. Keep all follow-up visits. This is important.  Ask for help if you have counseling or nutritional needs during pregnancy. Your health care provider can offer advice or refer you to specialists for help with various needs.  Schedule a dentist appointment. At home, brush your teeth with a soft toothbrush. Floss gently.  Write down your questions. Take them to your prenatal visits. Where to find more information  American Pregnancy Association: americanpregnancy.org  [employer] of Obstetricians and Gynecologists: https://www.todd-brady.net/  Office on [employer] Health: MightyReward.co.nz Contact a health care provider if you have:  Dizziness.  A fever.  Mild pelvic cramps, pelvic pressure, or nagging pain in the abdominal area.  Nausea, vomiting, or diarrhea that lasts for 24 hours or longer.  A bad-smelling vaginal discharge.  Pain when you urinate.  Known exposure to a contagious illness, such as chickenpox, measles, Zika virus, HIV, or hepatitis. Get help right away if you have:  Spotting or bleeding from your vagina.  Severe abdominal cramping or pain.  Shortness of breath or chest pain.  Any kind of trauma, such as from a fall or a car crash.  New or increased pain, swelling, or redness in an arm or leg. Summary  The first trimester of pregnancy starts on the first day of your last menstrual period until the end of week 12 (months 1 through 3).  Eating 4 or 5 small meals a day rather than 3 large meals may help to relieve nausea and vomiting.  Do not use any products that contain nicotine or tobacco, such as cigarettes, e-cigarettes, and chewing  tobacco. If you need help quitting, ask your health care provider.  Keep all follow-up visits. This is important. This information is not intended to replace advice given to you by your health care provider. Make sure you discuss any questions you have with your health care provider. Document Revised: 06/11/2019 Document Reviewed: 04/17/2019 Elsevier Patient Education  2021 [employer].

## 2020-05-20 NOTE — Progress Notes (Signed)
Subjective:  Akirra Lacerda is a 35 y.o. G1P0 at 23w6dbeing seen today for her first OB visit. EDD by first trimester U/S. Chronic medical problems as noted.   She is currently monitored for the following issues for this high-risk pregnancy and has Supervision of high risk pregnancy, antepartum; Anxiety; AMA (advanced maternal age) multigravida 327+ Trichomonosis; Abnormal brain MRI; and Syncopal episodes on their problem list.  Patient reports no complaints.  Contractions: Not present. Vag. Bleeding: None.  Movement: Absent. Denies leaking of fluid.   The following portions of the patient's history were reviewed and updated as appropriate: allergies, current medications, past family history, past medical history, past social history, past surgical history and problem list. Problem list updated.  Objective:   Vitals:   05/20/20 0905  BP: 112/67  Pulse: 80  Weight: 135 lb (61.2 kg)    Fetal Status: Fetal Heart Rate (bpm): 141   Movement: Absent     General:  Alert, oriented and cooperative. Patient is in no acute distress.  Skin: Skin is warm and dry. No rash noted.   Cardiovascular: Normal heart rate noted  Respiratory: Normal respiratory effort, no problems with respiration noted  Abdomen: Soft, gravid, appropriate for gestational age. Pain/Pressure: Present     Pelvic:  Cervical exam performed        Extremities: Normal range of motion.  Edema: None  Mental Status: Normal mood and affect. Normal behavior. Normal judgment and thought content.  Breast sym supple no masses tenderness or adenopathy  Urinalysis:      Assessment and Plan:  Pregnancy: G1P0 at 171w6d1. Supervision of high risk pregnancy, antepartum Prenatal care and labs reviewed with pt Genetic testing reviewed with pt - CBC - ABO/Rh; Future - Antibody screen; Future - Hepatitis C antibody - Hepatitis B surface antigen - HIV Antibody (routine testing w rflx) - RPR - Rubella screen - [facility] AMB BABYSCRIPTS OPT  IN - Culture, OB Urine - Genetic Screening - Protein / creatinine ratio, urine - TSH - Hemoglobin A1c - Cytology - PAP( [place]) - Cervicovaginal ancillary only( [place]) - Comp Met (CMET) - US[place]FM OB DETAIL +14 WK; Future  2. Trichomonosis  - Cervicovaginal ancillary only( [place])  3. Multigravida of advanced maternal age in first trimester Genetic testing reviewed. Will start BASA qd Indications reviewed with pt  4. Anxiety  - [place]ulatory [place] to [place]. Food insecurity  - AMBULATORY REFERRAL TO [facility]  6. Abnormal brain MRI Followed by neurology [facility], Contrast MRI not recommended during pregnancy Leave message with pt's Neurologist, Dr. [facility]nest [facility], that contrast MRI not recommended  7. Vasovagal syncope Followed by cardiology  Preterm labor symptoms and general obstetric precautions including but not limited to vaginal bleeding, contractions, leaking of fluid and fetal movement were reviewed in detail with the patient. Please refer to After Visit Summary for other counseling recommendations.  Return in about 4 weeks (around 06/17/2020) for OB visit, face to face, MD only.   ErChancy MilroyMD

## 2020-05-21 LAB — COMPREHENSIVE METABOLIC PANEL
ALT: 9 IU/L (ref 0–32)
AST: 15 IU/L (ref 0–40)
Albumin/Globulin Ratio: 2.3 — ABNORMAL HIGH (ref 1.2–2.2)
Albumin: 4.6 g/dL (ref 3.8–4.8)
Alkaline Phosphatase: 44 IU/L (ref 44–121)
BUN/Creatinine Ratio: 16 (ref 9–23)
BUN: 8 mg/dL (ref 6–20)
Bilirubin Total: 0.3 mg/dL (ref 0.0–1.2)
CO2: 21 mmol/L (ref 20–29)
Calcium: 9.1 mg/dL (ref 8.7–10.2)
Chloride: 100 mmol/L (ref 96–106)
Creatinine, Ser: 0.49 mg/dL — ABNORMAL LOW (ref 0.57–1.00)
Globulin, Total: 2 g/dL (ref 1.5–4.5)
Glucose: 86 mg/dL (ref 65–99)
Potassium: 4.1 mmol/L (ref 3.5–5.2)
Sodium: 133 mmol/L — ABNORMAL LOW (ref 134–144)
Total Protein: 6.6 g/dL (ref 6.0–8.5)
eGFR: 126 mL/min/{1.73_m2} (ref >59)

## 2020-05-21 LAB — CERVICOVAGINAL ANCILLARY ONLY
Chlamydia: NEGATIVE
Comment: NEGATIVE
Comment: NEGATIVE
Comment: NORMAL
Neisseria Gonorrhea: NEGATIVE
Trichomonas: NEGATIVE

## 2020-05-21 LAB — HEPATITIS B SURFACE ANTIGEN: Hepatitis B Surface Ag: NEGATIVE

## 2020-05-21 LAB — CYTOLOGY - PAP
Comment: NEGATIVE
Diagnosis: NEGATIVE
High risk HPV: NEGATIVE

## 2020-05-21 LAB — CBC
Hematocrit: 36.9 % (ref 34.0–46.6)
Hemoglobin: 12.7 g/dL (ref 11.1–15.9)
MCH: 31.9 pg (ref 26.6–33.0)
MCHC: 34.4 g/dL (ref 31.5–35.7)
MCV: 93 fL (ref 79–97)
Platelets: 375 10*3/uL (ref 150–450)
RBC: 3.98 x10E6/uL (ref 3.77–5.28)
RDW: 12.2 % (ref 11.7–15.4)
WBC: 13.9 10*3/uL — ABNORMAL HIGH (ref 3.4–10.8)

## 2020-05-21 LAB — RUBELLA SCREEN: Rubella Antibodies, IGG: 13.8 index (ref >0.99)

## 2020-05-21 LAB — HEMOGLOBIN A1C
Est. average glucose Bld gHb Est-mCnc: 105 mg/dL
Hgb A1c MFr Bld: 5.3 % (ref 4.8–5.6)

## 2020-05-21 LAB — HIV ANTIBODY (ROUTINE TESTING W REFLEX): HIV Screen 4th Generation wRfx: NONREACTIVE

## 2020-05-21 LAB — RPR: RPR Ser Ql: NONREACTIVE

## 2020-05-21 LAB — HEPATITIS C ANTIBODY: Hep C Virus Ab: 0.1 s/co ratio (ref 0.0–0.9)

## 2020-05-21 LAB — TSH: TSH: 0.742 u[IU]/mL (ref 0.450–4.500)

## 2020-05-22 LAB — URINE CULTURE, OB REFLEX: Organism ID, Bacteria: NO GROWTH

## 2020-05-22 LAB — PROTEIN / CREATININE RATIO, URINE
Creatinine, Urine: 174 mg/dL
Protein, Ur: 14.7 mg/dL
Protein/Creat Ratio: 84 mg/g creat (ref 0–200)

## 2020-05-22 LAB — CULTURE, OB URINE
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
## 2020-06-01 ENCOUNTER — Ambulatory Visit (INDEPENDENT_AMBULATORY_CARE_PROVIDER_SITE_OTHER): Payer: Medicaid Other | Admitting: Clinical

## 2020-06-01 DIAGNOSIS — Z658 Other specified problems related to psychosocial circumstances: Secondary | ICD-10-CM

## 2020-06-01 DIAGNOSIS — F331 Major depressive disorder, recurrent, moderate: Secondary | ICD-10-CM | POA: Diagnosis not present

## 2020-06-01 DIAGNOSIS — F419 Anxiety disorder, unspecified: Secondary | ICD-10-CM | POA: Diagnosis not present

## 2020-06-01 NOTE — Patient Instructions (Addendum)
[facility] [address] [place], [place] [phone] (main office) [phone] [facility](Ameila Weldon's office)  [email or web address] (view virtual tour of women's hospital, register for childbirth education classes, etc.)  If you are in need of transportation to get to and from your appointments in our office.  You can reach Transportation Services by calling [phone] Monday - Friday  7am-6pm (keep scrolling below for additional transportation resources)    Coping with Panic Attacks   What is a panic attack?  You may have had a panic attack if you experienced four or more of the symptoms listed below coming on abruptly and peaking in about 10 minutes.  Panic Symptoms   . Pounding heart  . Sweating  . Trembling or shaking  . Shortness of breath  . Feeling of choking  . Chest pain  . Nausea or abdominal distress    . Feeling dizzy, unsteady, lightheaded, or faint  . Feelings of unreality or being detached from yourself  . Fear of losing control or going crazy  . Fear of dying  . Numbness or tingling  . Chills or hot flashes      Panic attacks are sometimes accompanied by avoidance of certain places or situations. These are often situations that would be difficult to escape from or in which help might not be available. Examples might include crowded shopping malls, public transportation, restaurants, or driving.   Why do panic attacks occur?   Panic attacks are the body's alarm system gone awry. All of us have a built-in alarm system, powered by adrenaline, which increases our heart rate, breathing, and blood flow in response to danger. Ordinarily, this 'danger response system' works well. In some people, however, the response is either out of proportion to whatever stress is going on, or may come out of the blue without any stress at all.   For example, if you are walking in the woods and see a bear coming your way, a  variety of changes occur in your body to prepare you to either fight the danger or flee from the situation. Your heart rate will increase to get more blood flow around your body, your breathing rate will quicken so that more oxygen is available, and your muscles will tighten in order to be ready to fight or run. You may feel nauseated as blood flow leaves your stomach area and moves into your limbs. These bodily changes are all essential to helping you survive the dangerous situation. After the danger has passed, your body functions will begin to go back to normal. This is because your body also has a system for "recovering" by bringing your body back down to a normal state when the danger is over.   As you can see, the emergency response system is adaptive when there is, in fact, a "true" or "real" danger (e.g., bear). However, sometimes people find that their emergency response system is triggered in "everyday" situations where there really is no true physical danger (e.g., in a meeting, in the grocery store, while driving in normal traffic, etc.).   What triggers a panic attack?  Sometimes particularly stressful situations can trigger a panic attack. For example, an argument with your spouse or stressors at work can cause a stress response (activating the emergency response system) because you perceive it as threatening or overwhelming, even if there is no direct risk to your survival.  Sometimes panic attacks don't seem to be triggered by anything in particular-  they may "come out of the blue". Somehow, the natural "fight or flight" emergency response system has gotten activated when there is no real danger. Why does the body go into "emergency mode" when there is no real danger?   Often, people with panic attacks are frightened or alarmed by the physical sensations of the emergency response system. First, unexpected physical sensations are experienced (tightness in your chest or some shortness of  breath). This then leads to feeling fearful or alarmed by these symptoms ("Something's wrong!", "Am I having a heart attack?", "Am I going to faint?") The mind perceives that there is a danger even though no real danger exists. This, in turn, activates the emergency response system ("fight or flight"), leading to a "full blown" panic attack. In summary, panic attacks occur when we misinterpret physical symptoms as signs of impending death, craziness, loss of control, embarrassment, or fear of fear. Sometimes you may be aware of thoughts of danger that activate the emergency response system (for example, thinking "I'm having a heart attack" when you feel chest pressure or increased heart rate). At other times, however, you may not be aware of such thoughts. After several incidences of being afraid of physical sensations, anxiety and panic can occur in response to the initial sensations without conscious thoughts of danger. Instead, you just feel afraid or alarmed. In other words, the panic or fear may seem to occur "automatically" without you consciously telling yourself anything.   After having had one or more panic attacks, you may also become more focused on what is going on inside your body. You may scan your body and be more vigilant about noticing any symptoms that might signal the start of a panic attack. This makes it easier for panic attacks to happen again because you pick up on sensations you might otherwise not have noticed, and misinterpret them as something dangerous. A panic attack may then result.      How do I cope with panic attacks?  An important part of overcoming panic attacks involves re-interpreting your body's physical reactions and teaching yourself ways to decrease the physical arousal. This can be done through practicing the cognitive and behavioral interventions below.   Research has found that over half of people who have panic attacks show some signs of hyperventilation or  overbreathing. This can produce initial sensations that alarm you and lead to a panic attack. Overbreathing can also develop as part of the panic attack and make the symptoms worse. When people hyperventilate, certain blood vessels in the body become narrower. In particular, the brain may get slightly less oxygen. This can lead to the symptoms of dizziness, confusion, and lightheadedness that often occur during panic attacks. Other parts of the body may also get a bit less oxygen, which may lead to numbness or tingling in the hands or feet or the sensation of cold, clammy hands. It also may lead the heart to pump harder. Although these symptoms may be frightening and feel unpleasant, it is important to remember that hyperventilating is not dangerous. However, you can help overcome the unpleasantness of overbreathing by practicing Breathing Retraining.   Practice this basic technique three times a day, every day:  . Inhale. With your shoulders relaxed, inhale as slowly and deeply as you can while you count to six. If you can, use your diaphragm to fill your lungs with air.  . Hold. Keep the air in your lungs as you slowly count to four.  . Exhale. Slowly breath  out as you count to six.  . Repeat. Do the inhale-hold-exhale cycle several times. Each time you do it, exhale for longer counts.  Like any new skill, Breathing Retraining requires practice. Try practicing this skill twice a day for several minutes. Initially, do not try this technique in specific situations or when you become frightened or have a panic attack. Begin by practicing in a quiet environment to build up your skill level so that you can later use it in time of "emergency."   2. Decreasing Avoidance  Regardless of whether you can identify why you began having panic attacks or whether they seemed to come out of the blue, the places where you began having panic attacks often can become triggers themselves. It is not uncommon for individuals  to begin to avoid the places where they have had panic attacks. Over time, the individual may begin to avoid more and more places, thereby decreasing their activities and often negatively impacting their quality of life. To break the cycle of avoidance, it is important to first identify the places or situations that are being avoided, and then to do some "relearning."  To begin this intervention, first create a list of locations or situations that you tend to avoid. Then choose an avoided location or situation that you would like to target first. Now develop an "exposure hierarchy" for this situation or location. An "exposure hierarchy" is a list of actions that make you feel anxious in this situation. Order these actions from least to most anxiety-producing. It is often helpful to have the first item on your hierarchy involve thinking or imagining part of the feared/avoided situation.   Here is an example of an exposure hierarchy for decreasing avoidance of the grocery store. Note how it is ordered from the least amount of anxiety (at the top) to the most anxiety (at the bottom):  Marland Kitchen Think about going to the grocery store alone.  . Go to the grocery store with a friend or family member.  . Go to the grocery store alone to pick up a few small items (5-10 minutes in the store).  . Shopping for 10-20 minutes in the store alone.  . Doing the shopping for the week by myself (20-30 minutes in the store).   Your homework is to "expose" yourself to the lowest item on your hierarchy and use your breathing relaxation and coping statements (see below) to help you remain in the situation. Practice this several times during the upcoming week. Once you have mastered each item with minimal anxiety, move on to the next higher action on your list.   Cognitive Interventions  1. Identify your negative self-talk Anxious thoughts can increase anxiety symptoms and panic. The first step in changing anxious thinking is to  identify your own negative, alarming self-talk. Some common alarming thoughts:  . I'm having a heart attack.            . I must be going crazy. . I think I'm dying. Marland Kitchen People will think I'm crazy. . I'm going to pass our.  . Oh no- here it comes.  . I can't stand this.  Peggye Form got to get out of here!  2. Use positive coping statements Changing or disrupting a pattern of anxious thoughts by replacing them with more calming or supportive statements can help to divert a panic attack. Some common helpful coping statements:  . This is not an emergency.  . I don't like feeling this way, but  I can accept it.  . I can feel like this and still be okay.  . This has happened before, and I was okay. I'll be okay this time, too.  . I can be anxious and still deal with this situation.   Transportation Resources [employer] ([employer]) [address] [place], [place], [place] [address] https://[email or web address].gov/departments/transportation/gdot-divisions/[place]-transit-agency-public-transportation-division     . Fixed-route bus services, including regional fare cards for [place], [place], [place], and [place] buses.  . Reduced fare bus ID's available for Medicaid, Medicare, and "orange card" recipients.  Marland Kitchen SCAT offers curb-to-curb and door-to-door bus services for people with disabilities who are unable to use a fixed-bus route; also offers a shared-ride program.   Helpful tips:  -Routes available online and physical maps available at the main bus hub lobby (each for a specific route) -Smartphone directions often include bus routes (see the "bus" icon, next to the "car" and "walk" icons) -Routes differ on weekends, evenings and holidays, so plan ahead!  -If you have Medicaid, Medicare, or orange card, plan to obtain a reduced-fare ID to save 50% on rides. Check days and times to obtain an ID, and bring all necessary documents.   [employer] [place]) [address] [place], [place]. [address], [place], [place] [address] [phone] [email or web address] **Fixed-bus route services, and demand response bus service for older adults  Department of Social [facility] [address] [place], [place] [address] [phone] [email or web address] **Medicaid transportation is available to [facility] recipients who need assistance getting to [facility] medical appointments and providers  [facility] [address] [place] [address], [place], [place] [address] [email or web address]  ** Offers non-emergency transportation for medical appointments  [employer] [address], [place], [place] [address] [phone] [email or web address] **REFERRAL NEEDED by specific agencies (see website), after meeting specified criteria only  [employer] for [employer]) [address], [place], [place] [address] [phone]  BuyingShow.es  *Regional fixed-bus routes between counties (example: [place] to [place]) and [employer]         /[employer] and Websites Here are a few free apps meant to help you to help yourself.  To find, try searching on the internet to see if the app is offered on Apple/Android devices. If your first choice doesn't come up on your device, the good news is that there are many choices! Play around with different apps to see which ones are helpful to you.    Calm This is an app meant to help increase calm feelings. Includes info, strategies, and tools for tracking your feelings.      Calm Harm  This app is meant to help with self-harm. Provides many 5-minute or 15-min coping strategies for doing instead of hurting yourself.       Healthy Minds Health Minds is a problem-solving tool to help deal with emotions and cope with stress you encounter wherever  you are.      MindShift This app can help people cope with anxiety. Rather than trying to avoid anxiety, you can make an important shift and face it.      MY3  MY3 features a support system, safety plan and resources with the goal of offering a tool to use in a time of need.       My Life My Voice  This mood journal offers a simple solution for tracking your thoughts, feelings and moods. Animated emoticons can help identify your mood.  Relax Melodies Designed to help with sleep, on this app you can mix sounds and meditations for relaxation.      Smiling Mind Smiling Mind is meditation made easy: it's a simple tool that helps put a smile on your mind.        Stop, Breathe & Think  A friendly, simple guide for people through meditations for mindfulness and compassion.  Stop, Breathe and Think Kids Enter your current feelings and choose a "mission" to help you cope. Offers videos for certain moods instead of just sound recordings.       Team Orange The goal of this tool is to help teens change how they think, act, and react. This app helps you focus on your own good feelings and experiences.      The [employer] The [employer] (V[employer]) contains simple tools to help patients with coping, relaxation, distraction, and positive thinking.

## 2020-06-03 ENCOUNTER — Encounter: Payer: Self-pay | Admitting: *Deleted

## 2020-06-03 ENCOUNTER — Telehealth: Payer: Self-pay | Admitting: Lactation Services

## 2020-06-03 ENCOUNTER — Encounter: Payer: Self-pay | Admitting: Lactation Services

## 2020-06-03 NOTE — Telephone Encounter (Signed)
Called patient to inform her of results of [facility] Scre[facility]. 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    Advised she call [facility] to schedule a Telephone Genetic Counseling Session to discuss results and that testing for FOB is recommended. Reviewed we do have kits in out office for FOB testing. Patient report they cannot afford to pay for the testing, reviewed there is an application they can apply for assistance.   Patient asked that all information be sent in My Chart message as she is at a public event and cannot write information down. My chart message sent.

## 2020-06-04 ENCOUNTER — Encounter: Payer: Self-pay | Admitting: Obstetrics and Gynecology

## 2020-06-04 DIAGNOSIS — Z148 Genetic carrier of other disease: Secondary | ICD-10-CM | POA: Insufficient documentation

## 2020-06-09 ENCOUNTER — Encounter: Payer: Self-pay | Admitting: *Deleted

## 2020-06-10 DIAGNOSIS — G40219 Localization-related (focal) (partial) symptomatic epilepsy and epileptic syndromes with complex partial seizures, intractable, without status epilepticus: Secondary | ICD-10-CM | POA: Diagnosis not present

## 2020-06-15 DIAGNOSIS — R0789 Other chest pain: Secondary | ICD-10-CM | POA: Diagnosis not present

## 2020-06-15 DIAGNOSIS — R002 Palpitations: Secondary | ICD-10-CM | POA: Diagnosis not present

## 2020-06-15 DIAGNOSIS — R55 Syncope and collapse: Secondary | ICD-10-CM | POA: Diagnosis not present

## 2020-06-17 ENCOUNTER — Other Ambulatory Visit (HOSPITAL_COMMUNITY)
Admission: RE | Admit: 2020-06-17 | Discharge: 2020-06-17 | Disposition: A | Discharge status: Home / Self Care | Payer: Medicaid Other | Source: Ambulatory Visit | Attending: Medical | Admitting: Medical

## 2020-06-17 ENCOUNTER — Other Ambulatory Visit: Payer: Self-pay

## 2020-06-17 ENCOUNTER — Encounter: Payer: Self-pay | Admitting: Medical

## 2020-06-17 ENCOUNTER — Ambulatory Visit (INDEPENDENT_AMBULATORY_CARE_PROVIDER_SITE_OTHER): Payer: Medicaid Other | Admitting: Medical

## 2020-06-17 VITALS — BP 135/71 | HR 78 | Wt 144.0 lb

## 2020-06-17 DIAGNOSIS — R9089 Other abnormal findings on diagnostic imaging of central nervous system: Secondary | ICD-10-CM

## 2020-06-17 DIAGNOSIS — O26892 Other specified pregnancy related conditions, second trimester: Secondary | ICD-10-CM

## 2020-06-17 DIAGNOSIS — A599 Trichomoniasis, unspecified: Secondary | ICD-10-CM

## 2020-06-17 DIAGNOSIS — Z148 Genetic carrier of other disease: Secondary | ICD-10-CM

## 2020-06-17 DIAGNOSIS — N898 Other specified noninflammatory disorders of vagina: Secondary | ICD-10-CM

## 2020-06-17 DIAGNOSIS — O0990 Supervision of high risk pregnancy, unspecified, unspecified trimester: Secondary | ICD-10-CM | POA: Diagnosis not present

## 2020-06-17 DIAGNOSIS — R55 Syncope and collapse: Secondary | ICD-10-CM

## 2020-06-17 DIAGNOSIS — N949 Unspecified condition associated with female genital organs and menstrual cycle: Secondary | ICD-10-CM

## 2020-06-17 DIAGNOSIS — F419 Anxiety disorder, unspecified: Secondary | ICD-10-CM

## 2020-06-17 DIAGNOSIS — Z3A16 16 weeks gestation of pregnancy: Secondary | ICD-10-CM

## 2020-06-17 DIAGNOSIS — O09522 Supervision of elderly multigravida, second trimester: Secondary | ICD-10-CM

## 2020-06-17 NOTE — Progress Notes (Signed)
   PRENATAL VISIT NOTE  Subjective:  Jane Tran is a 35 y.o. G1P0 at [redacted]w[redacted]d being seen today for ongoing prenatal care.  She is currently monitored for the following issues for this high-risk pregnancy and has Supervision of high risk pregnancy, antepartum; Anxiety; AMA (advanced maternal age) multigravida 35+; Trichomonosis; Abnormal brain MRI; Syncopal episodes; and Carrier of spinal muscular atrophy on their problem list.  Patient reports round ligament pain, fatigue.  Contractions: Not present. Vag. Bleeding: None.  Movement: Absent. Denies leaking of fluid.   The following portions of the patient's history were reviewed and updated as appropriate: allergies, current medications, past family history, past medical history, past social history, past surgical history and problem list.   Objective:   Vitals:   06/17/20 0850 06/17/20 1006  BP: (!) 149/68 135/71  Pulse: 80 78  Weight: 144 lb (65.3 kg)     Fetal Status: Fetal Heart Rate (bpm): 140   Movement: Absent     General:  Alert, oriented and cooperative. Patient is in no acute distress.  Skin: Skin is warm and dry. No rash noted.   Cardiovascular: Normal heart rate noted  Respiratory: Normal respiratory effort, no problems with respiration noted  Abdomen: Soft, gravid, appropriate for gestational age.  Pain/Pressure: Absent     Pelvic: Cervical exam deferred        Extremities: Normal range of motion.  Edema: Trace  Mental Status: Normal mood and affect. Normal behavior. Normal judgment and thought content.   Assessment and Plan:  Pregnancy: G1P0 at [redacted]w[redacted]d 1. Supervision of high risk pregnancy, antepartum - Discussed MOC, still undecided  - Prenatal class information given  - Discussed importance of peds, has list  - AFP, Serum, Open Spina Bifida - Anatomy US scheduled 07/02/20  2. 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 Vergie Living, Keppra is medication of choice and advised patient start   3. Carrier of spinal muscular atrophy - Patient aware  4. Multigravida of advanced maternal age in second trimester - < 72 yo, no change in management   5. Anxiety  6. Vasovagal syncope  7. Trichomonosis - TOC negative 05/20/20  8. Vaginal discharge during pregnancy in second trimester - Cervicovaginal ancillary only( [place]) - Self-swab today - white discharge, without odor, occasional itching  9. Round ligament pain - Discussed Tylenol PRN for pain, abdominal binder, hydrotherapy and heat therapy   10. Elevated BP in pregnancy, second trimester  - Intermittent elevated BP, including first check today  - Repeat normal  - Baseline labs drawn previously  - UA without proteinuria today, denies HA, visual changes, mild LE edema at the end of the day x 1 month   11.[redacted] weeks gestation of pregnancy  Preterm labor symptoms and general obstetric precautions including but not limited to vaginal bleeding, contractions, leaking of fluid and fetal movement were reviewed in detail with the patient. Please refer to After Visit Summary for other counseling recommendations.   Return in about 4 weeks (around 07/15/2020) for [facility] MD only, In-Person.  Future Appointments  Date Time Provider Department Center  06/25/2020  1:00 PM [facility] CCC-MM CARE MANAGER [facility] None  07/02/2020 12:45 PM [facility] NURSE [facility]  07/02/2020  1:00 PM [facility] US1 [facility]  07/14/2020  8:55 AM [name], MD [facility]    Vonzella Nipple, [name]

## 2020-06-17 NOTE — Patient Instructions (Addendum)
Second Trimester of Pregnancy  The second trimester of pregnancy is from week 13 through week 27. This is also called months 4 through 6 of pregnancy. This is often the time when you feel your best. During the second trimester:  Morning sickness is less or has stopped.  You may have more energy.  You may feel hungry more often. At this time, your unborn baby (fetus) is growing very fast. At the end of the sixth month, the unborn baby may be up to 12 inches long and weigh about 1 pounds. You will likely start to feel the baby move between 16 and 20 weeks of pregnancy. Body changes during your second trimester Your body continues to go through many changes during this time. The changes vary and generally return to normal after the baby is born. Physical changes  You will gain more weight.  You may start to get stretch marks on your hips, belly (abdomen), and breasts.  Your breasts will grow and may hurt.  Dark spots or blotches may develop on your face.  A dark line from your belly button to the pubic area (linea nigra) may appear.  You may have changes in your hair. Health changes  You may have headaches.  You may have heartburn.  You may have trouble pooping (constipation).  You may have hemorrhoids or swollen, bulging veins (varicose veins).  Your gums may bleed.  You may pee (urinate) more often.  You may have back pain. Follow these instructions at home: Medicines  Take over-the-counter and prescription medicines only as told by your doctor. Some medicines are not safe during pregnancy.  Take a prenatal vitamin that contains at least 600 micrograms (mcg) of folic acid. Eating and drinking  Eat healthy meals that include: ? Fresh fruits and vegetables. ? Whole grains. ? Good sources of protein, such as meat, eggs, or tofu. ? Low-fat dairy products.  Avoid raw meat and unpasteurized juice, milk, and cheese.  You may need to take these actions to prevent or  treat trouble pooping: ? Drink enough fluids to keep your pee (urine) pale yellow. ? Eat foods that are high in fiber. These include beans, whole grains, and fresh fruits and vegetables. ? Limit foods that are high in fat and sugar. These include fried or sweet foods. Activity  Exercise only as told by your doctor. Most people can do their usual exercise during pregnancy. Try to exercise for 30 minutes at least 5 days a week.  Stop exercising if you have pain or cramps in your belly or lower back.  Do not exercise if it is too hot or too humid, or if you are in a place of great height (high altitude).  Avoid heavy lifting.  If you choose to, you may have sex unless your doctor tells you not to. Relieving pain and discomfort  Wear a good support bra if your breasts are sore.  Take warm water baths (sitz baths) to soothe pain or discomfort caused by hemorrhoids. Use hemorrhoid cream if your doctor approves.  Rest with your legs raised (elevated) if you have leg cramps or low back pain.  If you develop bulging veins in your legs: ? Wear support hose as told by your doctor. ? Raise your feet for 15 minutes, 3-4 times a day. ? Limit salt in your food. Safety  Wear your seat belt at all times when you are in a car.  Talk with your doctor if someone is hurting you or yelling  at you a lot. Lifestyle  Do not use hot tubs, steam rooms, or saunas.  Do not douche. Do not use tampons or scented sanitary pads.  Avoid cat litter boxes and soil used by cats. These carry germs that can harm your baby and can cause a loss of your baby by miscarriage or stillbirth.  Do not use herbal medicines, illegal drugs, or medicines that are not approved by your doctor. Do not drink alcohol.  Do not smoke or use any products that contain nicotine or tobacco. If you need help quitting, ask your doctor. General instructions  Keep all follow-up visits. This is important.  Ask your doctor about local  prenatal classes.  Ask your doctor about the right foods to eat or for help finding a counselor. Where to find more information  American Pregnancy Association: americanpregnancy.org  [employer] and Gynecologists: www.acog.org  Office on [employer] Health: MightyReward.co.nz Contact a doctor if:  You have a headache that does not go away when you take medicine.  You have changes in how you see, or you see spots in front of your eyes.  You have mild cramps, pressure, or pain in your lower belly.  You continue to feel like you may vomit (nauseous), you vomit, or you have watery poop (diarrhea).  You have bad-smelling fluid coming from your vagina.  You have pain when you pee or your pee smells bad.  You have very bad swelling of your face, hands, ankles, feet, or legs.  You have a fever. Get help right away if:  You are leaking fluid from your vagina.  You have spotting or bleeding from your vagina.  You have very bad belly cramping or pain.  You have trouble breathing.  You have chest pain.  You faint.  You have not felt your baby move for the time period told by your doctor.  You have new or increased pain, swelling, or redness in an arm or leg. Summary  The second trimester of pregnancy is from week 13 through week 27 (months 4 through 6).  Eat healthy meals.  Exercise as told by your doctor. Most people can do their usual exercise during pregnancy.  Do not use herbal medicines, illegal drugs, or medicines that are not approved by your doctor. Do not drink alcohol.  Call your doctor if you get sick or if you notice anything unusual about your pregnancy. This information is not intended to replace advice given to you by your health care provider. Make sure you discuss any questions you have with your health care provider. Document Revised: 06/11/2019 Document Reviewed: 04/17/2019 Elsevier Patient Education  2021 Elsevier  Inc.  Childbirth Education Options: [facility] Department Classes:  Childbirth education classes can help you get ready for a positive parenting experience. You can also meet other expectant parents and get free stuff for your baby. Each class runs for five weeks on the same night and costs $45 for the mother-to-be and her support person. Medicaid covers the cost if you are eligible. Call [phone] to register. [facility] Childbirth Education:  [phone] or [phone] or sophia.law@[place] .com  Baby & Me Class: Discuss newborn & infant parenting and family adjustment issues with other new mothers in a relaxed environment. Each week brings a new speaker or baby-centered activity. We encourage new mothers to join [place] every Thursday at 11:00am. Babies birth until crawling. No registration or fee. Daddy [employer]: This course offers Dads-to-be the tools and knowledge needed to feel  confident on their journey to becoming new fathers. Experienced dads, who have been trained as coaches, teach dads-to-be how to hold, comfort, diaper, swaddle and play with their infant while being able to support the new mom as well. A class for men taught by men. $25/dad Big Brother/Big Sister: Let your children share in the joy of a new brother or sister in this special class designed just for them. Class includes discussion about how families care for babies: swaddling, holding, diapering, safety as well as how they can be helpful in their new role. This class is designed for children ages 2 to 57, but any age is welcome. Please register each child individually. $5/child  Mom Talk: This mom-led group offers support and connection to mothers as they journey through the adjustments and struggles of that sometimes overwhelming first year after the birth of a child. Tuesdays at 10:00am and Thursdays at 6:00pm. Babies welcome. No registration or fee. Breastfeeding Support Group: This group is a  mother-to-mother support circle where moms have the opportunity to share their breastfeeding experiences. A Lactation Consultant is present for questions and concerns. Meets each Tuesday at 11:00am. No fee or registration. Breastfeeding Your Baby: Learn what to expect in the first days of breastfeeding your newborn.  This class will help you feel more confident with the skills needed to begin your breastfeeding experience. Many new mothers are concerned about breastfeeding after leaving the hospital. This class will also address the most common fears and challenges about breastfeeding during the first few weeks, months and beyond. (call for fee) Comfort Techniques and Tour: This 2 hour interactive class will provide you the opportunity to learn & practice hands-on techniques that can help relieve some of the discomfort of labor and encourage your baby to rotate toward the best position for birth. You and your partner will be able to try a variety of labor positions with birth balls and rebozos as well as practice breathing, relaxation, and visualization techniques. A tour of the [facility] is included with this class. $20 per registrant and support person Childbirth Class- Weekend Option: This class is a Weekend version of our Birth & Baby series. It is designed for parents who have a difficult time fitting several weeks of classes into their schedule. It covers the care of your newborn and the basics of labor and childbirth. It also includes a Maternity Care Center Tour of [facility] and lunch. The class is held two consecutive days: beginning on Friday evening from 6:30 - 8:30 p.m. and the next day, Saturday from 9 a.m. - 4 p.m. (call for fee) Linden Dolin Class: Interested in a waterbirth?  This informational class will help you discover whether waterbirth is the right fit for you. Education about waterbirth itself, supplies you would need and how to assemble your support team  is what you can expect from this class. Some obstetrical practices require this class in order to pursue a waterbirth. (Not all obstetrical practices offer waterbirth-check with your healthcare provider.) Register only the expectant mom, but you are encouraged to bring your partner to class! Required if planning waterbirth, no fee. Infant/Child CPR: Parents, grandparents, babysitters, and friends learn Cardio-Pulmonary Resuscitation skills for infants and children. You will also learn how to treat both conscious and unconscious choking in infants and children. This Family & Friends program does not offer certification. Register each participant individually to ensure that enough mannequins are available. (Call for fee) Grandparent Love: Expecting a grandbaby? This class  is for you! Learn about the latest infant care and safety recommendations and ways to support your own child as he or she transitions into the parenting role. Taught by Registered Nurses who are childbirth instructors, but most importantly...they are grandmothers too! $10/person. Childbirth Class- Natural Childbirth: This series of 5 weekly classes is for expectant parents who want to learn and practice natural methods of coping with the process of labor and childbirth. Relaxation, breathing, massage, visualization, role of the partner, and helpful positioning are highlighted. Participants learn how to be confident in their body's ability to give birth. This class will empower and help parents make informed decisions about their own care. Includes discussion that will help new parents transition into the immediate postpartum period. Maternity Care Center Tour of [facility] is included. We suggest taking this class between 25-32 weeks, but it's only a recommendation. $75 per registrant and one support person or $30 Medicaid. Childbirth Class- 3 week Series: This option of 3 weekly classes helps you and your labor partner prepare for  childbirth. Newborn care, labor & birth, cesarean birth, pain management, and comfort techniques are discussed and a Maternity Care Center Tour of [facility] is included. The class meets at the same time, on the same day of the week for 3 consecutive weeks beginning with the starting date you choose. $60 for registrant and one support person.  Marvelous Multiples: Expecting twins, triplets, or more? This class covers the differences in labor, birth, parenting, and breastfeeding issues that face multiples' parents. NICU tour is included. Led by a Certified Childbirth Educator who is the mother of twins. No fee. Caring for Baby: This class is for expectant and adoptive parents who want to learn and practice the most up-to-date newborn care for their babies. Focus is on birth through the first six weeks of life. Topics include feeding, bathing, diapering, crying, umbilical cord care, circumcision care and safe sleep. Parents learn to recognize symptoms of illness and when to call the pediatrician. Register only the mom-to-be and your partner or support person can plan to come with you! $10 per registrant and support person Childbirth Class- online option: This online class offers you the freedom to complete a Birth and Baby series in the comfort of your own home. The flexibility of this option allows you to review sections at your own pace, at times convenient to you and your support people. It includes additional video information, animations, quizzes, and extended activities. Get organized with helpful eClass tools, checklists, and trackers. Once you register online for the class, you will receive an email within a few days to accept the invitation and begin the class when the time is right for you. The content will be available to you for 60 days. $60 for 60 days of online access for you and your support people.

## 2020-06-17 NOTE — Progress Notes (Signed)
Left arm BP reading 149/68 pulse 80 Right arm BP reading 135/71 pulse 78 (after

## 2020-06-18 LAB — CERVICOVAGINAL ANCILLARY ONLY
Bacterial Vaginitis (gardnerella): POSITIVE — AB
Comment: NEGATIVE

## 2020-06-19 LAB — AFP, SERUM, OPEN SPINA BIFIDA
AFP MoM: 1.03
AFP Value: 36.8 ng/mL
Gest. Age on Collection Date: 16 weeks
Maternal Age At EDD: 35.6 yr
OSBR Risk 1 IN: 10000
Test Results:: NEGATIVE
Weight: 144 [lb_av]

## 2020-06-21 ENCOUNTER — Encounter: Payer: Self-pay | Admitting: Medical

## 2020-06-21 NOTE — BH Specialist Note (Signed)
Integrated Behavioral Health via Telemedicine Visit  06/21/2020 Jane Tran [id number]  Number of Integrated Behavioral Health visits: 2 Session Start time: 8:52  Session End time: 9:23 Total time: 31  Referring Provider: Nettie Elm, MD Patient/Family location: Home [facility] Provider location: Center for [facility]  All persons participating in visit: [name] and Jane Tran   Types of Service: Individual psychotherapy and Video visit  I connected with Jane Tran and/or Jane Tran n/a via  Telephone or Video Enabled Telemedicine Application  (Video is Caregility application) and verified that I am speaking with the correct person using two identifiers. Discussed confidentiality: Yes   I discussed the limitations of telemedicine and the availability of in person appointments.  Discussed there is a possibility of technology failure and discussed alternative modes of communication if that failure occurs.  I discussed that engaging in this telemedicine visit, they consent to the provision of behavioral healthcare and the services will be billed under their insurance.  Patient and/or legal guardian expressed understanding and consented to Telemedicine visit: Yes   Presenting Concerns: Patient and/or family reports the following symptoms/concerns: Pt states her primary concern today is mood instability with "high highs and low lows", "snapping for no reason", two panic attacks last week, "starting arguments", crying spells due to "unable to control emotions", and passive SI with no intent or plan. Pt is also worried about possibility of being uninsured/losing Medicaid postpartum, and wonders if seizures and memory issues are contributing to feelings of depression. Pt prefers to use walk-in [facility] urgent care rather than referral for outpatient appointment.  Duration of problem: Increase in current pregnancy; Severity of  problem: severe  Patient and/or Family's Strengths/Protective Factors: Social connections, Concrete supports in place (healthy food, safe environments, etc.) and Sense of purpose  Goals Addressed: Patient will: 1.  Reduce symptoms of: anxiety, depression, mood instability and stress  2.  Increase knowledge and/or ability of: Safety  Demonstrate ability to: Increase healthy adjustment to current life circumstances and Increase motivation to adhere to plan of care  Progress towards Goals: Ongoing  Interventions: Interventions utilized:  Motivational Interviewing, Psychoeducation and/or Health Education and Safety Standardized Assessments completed: Not Needed  Patient and/or Family Response: Pt agrees to treatment plan  Assessment: Patient currently experiencing Major depressive disorder, recurrent, moderate and Anxiety disorder, unspecified and Psychosocial stress.   Patient may benefit from continued psychoeducation and brief therapeutic interventions regarding coping with symptoms of mood instability, anxiety, depression, stress .  Plan: 1. Follow up with behavioral health clinician on : Two weeks; Call Jane Tran at [phone] as needed prior to scheduled visit  2. Behavioral recommendations:  -Use walk-in 24/7 [facility] when [facility] and/or panic returns -Continue with plan to discuss seizures and memory issues, as it relates to increased depression, with neurologist; begin taking Keppra as prescribed by neurologist -Continue taking prenatal vitamin daily as prescribed -Continue to consider resources given at last visit's After Visit Summary (transportation; www.conehealthybaby.com, apps for self-care) 3. Referral(s): Integrated Art gallery manager (In Clinic) and [employer] [facility] (LME/Outside Clinic)  I discussed the assessment and treatment plan with the patient and/or parent/guardian. They were provided an opportunity to ask  questions and all were answered. They agreed with the plan and demonstrated an understanding of the instructions.   They were advised to call back or seek an in-person evaluation if the symptoms worsen or if the condition fails to improve as anticipated.  Jane Close Heavan Francom, LCSW

## 2020-06-22 ENCOUNTER — Ambulatory Visit (INDEPENDENT_AMBULATORY_CARE_PROVIDER_SITE_OTHER): Payer: Medicaid Other | Admitting: Clinical

## 2020-06-22 ENCOUNTER — Other Ambulatory Visit: Payer: Self-pay

## 2020-06-22 DIAGNOSIS — O99340 Other mental disorders complicating pregnancy, unspecified trimester: Secondary | ICD-10-CM

## 2020-06-22 DIAGNOSIS — Z658 Other specified problems related to psychosocial circumstances: Secondary | ICD-10-CM

## 2020-06-22 DIAGNOSIS — F419 Anxiety disorder, unspecified: Secondary | ICD-10-CM | POA: Diagnosis not present

## 2020-06-22 DIAGNOSIS — F332 Major depressive disorder, recurrent severe without psychotic features: Secondary | ICD-10-CM | POA: Diagnosis not present

## 2020-06-22 DIAGNOSIS — Z3A00 Weeks of gestation of pregnancy not specified: Secondary | ICD-10-CM | POA: Diagnosis not present

## 2020-06-22 MED ORDER — METRONIDAZOLE 500 MG PO TABS: 500.0000 mg | ORAL_TABLET | Freq: Two times a day (BID) | ORAL | 0 refills | Status: DC

## 2020-06-22 NOTE — BH Specialist Note (Signed)
Integrated Behavioral Health via Telemedicine Visit  06/22/2020 Jane Tran [id number]  Number of Integrated Behavioral Health visits: 2 (3 total) Session Start time: 10:45  Session End time: 11:55 Total time:  57  Referring Provider: Nettie Elm, MD Patient/Family location: Home [facility] Provider location: Center for [facility]  All persons participating in visit: [name] and [facility] Jane Tran   Types of Service: Individual psychotherapy and Video visit  I connected with Jane Tran and/or Jane Tran  n/a  via  Telephone or Video Enabled Telemedicine Application  (Video is Caregility application) and verified that I am speaking with the correct person using two identifiers. Discussed confidentiality: Yes   I discussed the limitations of telemedicine and the availability of in person appointments.  Discussed there is a possibility of technology failure and discussed alternative modes of communication if that failure occurs.  I discussed that engaging in this telemedicine visit, they consent to the provision of behavioral healthcare and the services will be billed under their insurance.  Patient and/or legal guardian expressed understanding and consented to Telemedicine visit: Yes   Presenting Concerns: Patient and/or family reports the following symptoms/concerns: Pt states that panic/anxiety has decreased since starting Keppra for seizures; primary concern today is feeling overwhelmed and helpless/discouraged with major home repairs she is completing herself, with the goal of making sure home is secure and safe for baby.  Duration of problem: Current pregnancy; Severity of problem: severe  Patient and/or Family's Strengths/Protective Factors: Social connections, Concrete supports in place (healthy food, safe environments, etc.), Sense of purpose, and Physical Health (exercise, healthy diet, medication compliance,  etc.)  Goals Addressed: Patient will:  Reduce symptoms of: anxiety, depression, mood instability, and stress   Demonstrate ability to: Increase healthy adjustment to current life circumstances and Increase motivation to adhere to plan of care  Progress towards Goals: Ongoing  Interventions: Interventions utilized:  Motivational Interviewing and Supportive Reflection Standardized Assessments completed:  not given today  Patient and/or Family Response: Pt agrees to treatment plan  Assessment: Patient currently experiencing MDD, Anxiety disorder, and Psychosocial stress.   Patient may benefit from continued psychoeducation and brief therapeutic interventions regarding coping with symptoms of anxiety, depression, stress .  Plan: Follow up with behavioral health clinician on : One month Behavioral recommendations:  -Continue with house renovation/repair plans, to complete most important renovations prior to baby's arrival  -Continue to brainstorm and reach out to potential options for help completing household projects (possible housing resources on After Visit Summary), as discussed -Continue taking Keppra as prescribed Referral(s): Integrated Art gallery manager (In Clinic) and [employer]:  Housing  I discussed the assessment and treatment plan with the patient and/or parent/guardian. They were provided an opportunity to ask questions and all were answered. They agreed with the plan and demonstrated an understanding of the instructions.   They were advised to call back or seek an in-person evaluation if the symptoms worsen or if the condition fails to improve as anticipated.  Valetta Close Khy Pitre, LCSW

## 2020-06-25 ENCOUNTER — Other Ambulatory Visit: Payer: Self-pay | Admitting: *Deleted

## 2020-06-25 ENCOUNTER — Other Ambulatory Visit: Payer: Self-pay

## 2020-06-25 NOTE — Patient Outreach (Signed)
Medicaid Managed Care   Nurse Care Manager Note  06/25/2020 Name:  Jane Tran MRN:  [id number] DOB:  April 18, 1985  Jane Tran is an 35 y.o. year old female who is a primary patient of [name], [name], MD.  The Medicaid Managed Care Coordination team was consulted for assistance with:    Obstetrics healthcare management needs  Ms. Jane Tran was given information about Medicaid Managed Care Coordination team services today. Jane Tran agreed to services and verbal consent obtained.  Engaged with patient by telephone for initial visit in response to provider referral for case management and/or care coordination services.   Assessments/Interventions:  Review of past medical history, allergies, medications, health status, including review of consultants reports, laboratory and other test data, was performed as part of comprehensive evaluation and provision of chronic care management services.  SDOH (Social Determinants of Health) assessments and interventions performed:   Care Plan  Allergies  Allergen Reactions   Albuterol Other (See Comments) and Shortness Of Breath    States it cause her to hyperventlate States it cause her to hyperventlate    Methocarbamol Other (See Comments)    States it causes her to hyperventilate   Penicillins    Tramadol     Medications Reviewed Today     Reviewed by [name], RN (Registered Nurse) on 06/25/20 at 1050  Med List Status: <None>   Medication Order Taking? Sig Documenting Provider Last Dose Status Informant  acetaminophen (TYLENOL) 500 MG tablet [id number] No Take 2 tablets (1,000 mg total) by mouth every 8 (eight) hours as needed for moderate pain.  Patient not taking: No sig reported   [name], [place] Not Taking Active   aspirin EC 81 MG tablet [id number] No Take 1 tablet (81 mg total) by mouth daily. Take after 12 weeks for prevention of preeclampsia later in pregnancy  Patient not taking: No sig reported   Jane Staggers, MD Not Taking Active            Med Note (Lessly Stigler A   Fri Jun 25, 2020 10:44 AM) Needs to pick up medication  Blood Pressure Monitoring DEVI [id number] Yes 1 each by Does not apply route once a week. Jane Staggers, MD Taking Active   doxycycline (VIBRAMYCIN) 50 MG capsule [id number] No Take by mouth.  Patient not taking: No sig reported   [provider] Not Taking Active   folic acid (FOLVITE) 1 MG tablet [id number] No Take 1 mg by mouth daily.  Patient not taking: No sig reported   [provider] Not Taking Active            Med Note (Aylen Rambert A   Fri Jun 25, 2020 10:48 AM) Needs to pick up prescription  levETIRAcetam (KEPPRA) 500 MG tablet [id number] No Take 500 mg by mouth 2 (two) times daily.  Patient not taking: No sig reported   [provider] Not Taking Active            Med Note (Erickson Yamashiro A   Fri Jun 25, 2020 10:44 AM) Needs to pick up prescription  metroNIDAZOLE (FLAGYL) 500 MG tablet [id number] No Take 1 tablet (500 mg total) by mouth 2 (two) times daily.  Patient not taking: Reported on 06/25/2020   Jane Kudo, MD Not Taking Active            Med Note Jane Tran, Jane Tran A   Fri Jun 25, 2020 10:46 AM) Needs to pick up prescription  Multiple Vitamins-Minerals (MULTIVITAMIN WITH MINERALS) tablet [id number]  Take 1 tablet by mouth daily. [provider]  Consider Medication Status and Discontinue (Patient Preference)   Prenatal Vit-Fe Fumarate-FA (PRENATAL MULTIVITAMIN) TABS tablet [id number] Yes Take 1 tablet by mouth daily at 12 noon. [provider] Taking Active             Patient Active Problem List   Diagnosis Date Noted   Carrier of spinal muscular atrophy 06/04/2020   Trichomonosis 05/20/2020   Abnormal brain MRI 05/20/2020   Syncopal episodes 05/20/2020   Supervision of high risk pregnancy, antepartum 05/11/2020   Anxiety 05/11/2020   AMA (advanced maternal age) multigravida 35+ 05/11/2020     Conditions to be addressed/monitored per PCP order:   Obstetrical health management needs  Care Plan : General Plan of Care (Adult)  Updates made by [name], RN since 06/25/2020 12:00 AM     Problem: Health Promotion or Disease Self-Management (General Plan of Care)      Long-Range Goal: Self-Management Plan Developed   Start Date: 06/25/2020  Expected End Date: 12/24/2020  This Visit's Progress: On track  Priority: High  Note:   Current Barriers:  Ineffective Self Health Maintenance-Ms. Jane Tran is being evaluated for seizures vs syncopal episodes while being pregnant. 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 She is concerned about transportation to upcoming appointments, financial needs while making repairs to her home, obtaining medications and understanding her health while pregnant.  Does not adhere to prescribed medication regimen-Patient has not been able to afford prescribed medications. Her significant other gave her the money today for medications, she plans to get them and start taking today. Currently UNABLE TO independently self manage needs related to chronic health conditions.  Knowledge Deficits related to short term plan for care coordination needs and long term plans for chronic disease management needs Nurse Case Manager Clinical Goal(s):  patient will work with care management team to address care coordination and chronic disease management needs related to Care Coordination   Interventions:  Evaluation of current treatment plan related to pregnancy and patient's adherence to plan as established by provider. Advised patient to continue to work on smoking cessation, discussed cutting back one cigarette a week until she reaches her goal Provided education to patient re: hypertension Reviewed medications with patient and discussed the importance of picking up prescriptions and start taking as directed. Discussed Keppra and how important  it is to take regularly, encouraged patient to set an alarm on her phone to increase compliance Discussed plans with patient for ongoing care management follow up and provided patient with direct contact information for care management team Reviewed scheduled/upcoming provider appointments including: 6/17 for anatomy ultrasound, 6/21 and 6/29 at [facility], 6/30 with Dr. Comer Locket and 8/24-Cardiology Provided number for medical transportation provided by [facility] [phone] Encouraged patient to call [facility] for member benefits [phone] Self Care Activities:  Patient will self administer medications as prescribed Patient will attend all scheduled provider appointments Patient will call pharmacy for medication refills Patient will call provider office for new concerns or questions Patient Goals: - begin a notebook of services in my neighborhood or community - follow-up on any referrals for help I am given - make a list of family or friends that I can call - call [facility] [phone] for available benefits for pregnancy and possible assistance with home repairs  - call [phone] 2-3 days before your next appointment to  arrange for medical transportation provided by [facility] - arrange a ride through an agency 1 week before appointment - ask family or friend for a ride - call to cancel if needed - keep a calendar with prescription refill dates - keep a calendar with appointment dates Follow Up Plan: Telephone follow up appointment with care management team member scheduled for:07/29/20 @ 2:30pm      Follow Up:  Patient agrees to Care Plan and Follow-up.  Plan: The Managed Medicaid care management team will reach out to the patient again over the next 30 days.  Date/time of next scheduled RN care management/care coordination outreach:  07/29/20 @ 2:30pm  Estanislado Emms RN, BSN [place]  [facility] RN Care Coordinator

## 2020-06-25 NOTE — Patient Instructions (Signed)
Visit Information  Jane Tran was given information about Medicaid Managed Care team care coordination services as a part of their [facility] Community Plan Medicaid benefit. Jane Tran verbally consented to engagement with the [facility] Managed Care team.   For questions related to your [facility], please call: [phone] or visit the homepage here: [email or web address]  If you would like to schedule transportation through your [facility], please call the following number at least 2 days in advance of your appointment: [phone].   Call the Behavioral Health Crisis Line at [phone], at any time, 24 hours a day, 7 days a week. If you are in danger or need immediate medical attention call 911.  Jane Tran - following are the goals we discussed in your visit today:   Goals Addressed             This Visit's Progress    Find Help in My Community       Timeframe:  Long-Range Goal Priority:  High Start Date:    06/25/20                         Expected End Date:   12/25/20                    Follow Up Date 07/29/20    - begin a notebook of services in my neighborhood or community - follow-up on any referrals for help I am given - make a list of family or friends that I can call - call [employer] [phone] for available benefits for pregnancy and possible assistance with home repairs  - call [phone] 2-3 days before your next appointment to arrange for medical transportation provided by [facility]   Why is this important?   Knowing how and where to find help for yourself or family in your neighborhood and community is an important skill.  You will want to take some steps to learn how.          Make and Keep All Appointments       Timeframe:  Long-Range Goal Priority:  High Start Date:    06/25/20                         Expected End Date:  12/25/20                    Follow Up Date 07/29/20    - arrange a ride through an agency 1 week before appointment - ask family or friend for a ride - call to cancel if needed - keep a calendar with prescription refill dates - keep a calendar with appointment dates    Why is this important?   Part of staying healthy is seeing the doctor for follow-up care.  If you forget your appointments, there are some things you can do to stay on track.    Notes:          Please see education materials related to hypertension and pregnancy provided by MyChart link.  Patient verbalizes understanding of instructions provided today.   Telephone follow up appointment with Managed Medicaid care management team member scheduled for:07/29/20 @ 2:30pm  Estanislado Emms RN, BSN [place]  [facility] RN Care Coordinator   Following is a copy of your plan of care:  Patient Care Plan: General Plan of Care (Adult)  Problem Identified: Health Promotion or Disease Self-Management (General Plan of Care)      Long-Range Goal: Self-Management Plan Developed   Start Date: 06/25/2020  Expected End Date: 12/24/2020  This Visit's Progress: On track  Priority: High  Note:   Current Barriers:  Ineffective Self Health Maintenance-Jane Tran is being evaluated for seizures vs syncopal episodes while being pregnant. 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 She is concerned about transportation to upcoming appointments, financial needs while making repairs to her home, obtaining medications and understanding her health while pregnant.  Does not adhere to prescribed medication regimen-Patient has not been able to afford prescribed medications. Her significant other gave her the money today for medications, she plans to get them and start taking today. Currently UNABLE TO independently self manage needs related to chronic health conditions.  Knowledge Deficits related to short  term plan for care coordination needs and long term plans for chronic disease management needs Nurse Case Manager Clinical Goal(s):  patient will work with care management team to address care coordination and chronic disease management needs related to Care Coordination   Interventions:  Evaluation of current treatment plan related to pregnancy and patient's adherence to plan as established by provider. Advised patient to continue to work on smoking cessation, discussed cutting back one cigarette a week until she reaches her goal Provided education to patient re: hypertension Reviewed medications with patient and discussed the importance of picking up prescriptions and start taking as directed. Discussed Keppra and how important it is to take regularly, encouraged patient to set an alarm on her phone to increase compliance Discussed plans with patient for ongoing care management follow up and provided patient with direct contact information for care management team Reviewed scheduled/upcoming provider appointments including: 6/17 for anatomy ultrasound, 6/21 and 6/29 at [facility] for [facility], 6/30 with Dr. Comer Locket and 8/24-Cardiology Provided number for medical transportation provided by [facility] [phone] Encouraged patient to call [facility] for member benefits [phone] Self Care Activities:  Patient will self administer medications as prescribed Patient will attend all scheduled provider appointments Patient will call pharmacy for medication refills Patient will call provider office for new concerns or questions Patient Goals: - begin a notebook of services in my neighborhood or community - follow-up on any referrals for help I am given - make a list of family or friends that I can call - call [employer] [phone] for available benefits for pregnancy and possible assistance with home repairs  - call [phone] 2-3 days before your next appointment to  arrange for medical transportation provided by [facility] - arrange a ride through an agency 1 week before appointment - ask family or friend for a ride - call to cancel if needed - keep a calendar with prescription refill dates - keep a calendar with appointment dates Follow Up Plan: Telephone follow up appointment with care management team member scheduled for:07/29/20 @ 2:30pm

## 2020-07-02 ENCOUNTER — Ambulatory Visit: Payer: Medicaid Other | Attending: Obstetrics and Gynecology

## 2020-07-02 ENCOUNTER — Ambulatory Visit: Payer: Medicaid Other | Admitting: *Deleted

## 2020-07-02 ENCOUNTER — Other Ambulatory Visit: Payer: Self-pay

## 2020-07-02 ENCOUNTER — Encounter: Payer: Self-pay | Admitting: *Deleted

## 2020-07-02 VITALS — BP 108/56 | HR 76

## 2020-07-02 DIAGNOSIS — A599 Trichomoniasis, unspecified: Secondary | ICD-10-CM | POA: Diagnosis present

## 2020-07-02 DIAGNOSIS — O0990 Supervision of high risk pregnancy, unspecified, unspecified trimester: Secondary | ICD-10-CM

## 2020-07-05 ENCOUNTER — Other Ambulatory Visit: Payer: Self-pay | Admitting: *Deleted

## 2020-07-05 DIAGNOSIS — G40909 Epilepsy, unspecified, not intractable, without status epilepticus: Secondary | ICD-10-CM

## 2020-07-06 ENCOUNTER — Ambulatory Visit (INDEPENDENT_AMBULATORY_CARE_PROVIDER_SITE_OTHER): Payer: Medicaid Other | Admitting: Clinical

## 2020-07-06 DIAGNOSIS — F419 Anxiety disorder, unspecified: Secondary | ICD-10-CM

## 2020-07-06 DIAGNOSIS — Z658 Other specified problems related to psychosocial circumstances: Secondary | ICD-10-CM

## 2020-07-06 DIAGNOSIS — F332 Major depressive disorder, recurrent severe without psychotic features: Secondary | ICD-10-CM

## 2020-07-06 NOTE — Patient Instructions (Addendum)
[facility] ?[address] ?[place], [place] [address] ?[phone] (main office) ?[phone] (Moya Duan's office) ? ?Housing Resources ?                   ?[facility] (serves [place], [place], [place], [place], [place], [place], [place], [place], [place], [place], [place], [place], and [place] counties) ?[address], [place], [place] [address] ?(336) 904-0338 ?www.ptrc.org  ?**Rental assistance, Home Rehabilitation,Weatherization Assistance Program, Heating Appliance Repair and Replacement Program, Housing Voucher Program ? ?[facility]: ?Emergency Rental Assistance Resources to residents of [place], [place], and [place] ?Make sure you have your documents ready, including:  ?(Household income verification: 2 months pay stubs, unemployment/social security award letter, statement of no income for all household members over 18) ?Photo ID for all household members over 18 ?Utility Bill/Rent Ledger/Lease: must show past due amount for utilities/rent, or the rental agreement if rent is current ?2. Start your application online or by paper (in English or Spanish) at:  ?   [email or web address]  ?3. Once you have completed the online application, you will get an email confirmation message from the county. Expect to hear back by phone or email at least 6-10 weeks from submitting your application.  ?4. While you wait:  ?Call [phone] to check in on your application ?Let your landlord know that you've applied. Your landlord will be asked to submit documents (W-9) during this application process. Payments will be made directly to the landlord/property management company and utility company ?Rent or utility assistance for [place], [place], and [place] ?Apply at [email or web address] ?Questions? Call or email Renee at  [phone] or [email or web address]  ? ?Eviction Mediation Program: The HOPE Program ?Https://www.rebuild.[place].gov/hope-program ?HOPE [employer]gam serves low-income renters in [place] counties, defined as less than or equal to 80% of the area median income for the county where the renter lives. In the following 12 counties, you should apply to your local rent and utility assistance program INSTEAD OF the HOPE Program: [place], [place], [place], [place], [place], [place], [place], [place], [place], [place], [place], [place]  ?If you live outside of [place], contact HOPE call center at [phone] to talk to a Program Representative Monday-Friday, 8am-5pm ?Note that Native American tribes also received federal funding for rent and utility assistance programs. Recognized members of the following tribes will be served by programs managed by tribal governments, including: [place] Indians, [place] Tribe, [place] Indian Tribe, [place] Tribe of  and [place] Tribe ?Eviction Mediation Coordinator, Renee, [phone] [email or web address] ?Housing Resources Navigator, Stefanie, (336) [phone] [email or web address]  ? ?Housing Resources [place] ? ?[facility] [place] ?[address], [place], [place] [address] ?(336) 275-8501 ?www.gha-[place].org  ? ?[place] [facility] ?[address], [place], [place] [address] ?(336) 691-9521 ?www.gsohc.org ?**Programs include: Foreclosure Prevention and Housing Counseling, Healthy Homes/Tenant Advocacy, Homeless Prevention and Housing Assistance ? ?[employer] [facility]-[place] ?[address], [place], [place] [address] ?(336) 641-3383 ?www.[place].gov ?**housing applications/recertification; tax payment relief/exemption under specific qualifications ? ?[facility] ?[address] [place] [address], [place], [place] [address] ?www.online[place].com/~maryshouse ?**transitional housing for  women in recovery who have minor children or are pregnant ? ?[facility] ?[address], [place], [place] [address] ?www.ywcagsonc.org  ?**emergency shelter and support services for families facing homelessness ? ?[facility] ?[address], ,  [address] ?(336) 375-1332 ?www.youthfocus.org ?**transitional housing to pregnant women; emergency housing for youth who have run away, are experiencing a family   crisis, are victims of abuse or neglect, or are homeless ? ?[facility] ?[address], [place], [place] [address] ?(336) 332-0824 ?ircgso.org ?**Drop-in center for people experiencing homelessness; overnight warming center when temperature is 25 degrees or below ? ?[employer] ?[address], [place], [place] [address] ?(336) 588-6983 ?https://reentrystaffingagency.org/ ?**help with affordable housing to people experiencing homelessness or unemployment due to incarceration ? ?[facility] ?[address], [place], [place] [address] ?(336) 271-5988 ?www.greensborourbanministry.org  ?**emergency and transitional housing, rent/mortgage assistance, utility assistance ? ?[facility] ?[address], [place], [place] [address] ?(w36) 273-5572 ?www.salvationarmyof[place].org ?**emergency and transitional housing ? ?[facility] [place] ?[address], [place], [place] [address] ?(336) 275-4663 ?Www.habitatg[place].org  ? ?[facility] [employer] [facility] ?[address], [place], [place] [address] ?(336) 676-6986 ?https://chshousing.org ?**Home Ownership/Affordable Housing Program and Home Repair Program ? ?[facility] [facility] ?[address], [place], [place] [address] ?(336) 553-0946 ?www.housingconsultantsgroup.org ?**home buyer education courses, foreclosure prevention ? ?[place] [employer]HS-[employer] ?[address], [place], [place] [address] ?(336)  641-3771 ?http://eh.[place].gov ?**Environmental Exposure Assessment (investigation of homes where either children or pregnant women with a confirmed elevated blood lead level reside) ? ?[facility] ?[address], [place], [place] [address] ?(336) 487-0550 ?www.ncdhhs.gov/divisions/dvrs ?**Home Expense Assistance/Repairs Program; offers home accessibility updates, such as ramps or bars in the bathroom ? ?[facility] ?[address], [place], [place] [address] ?(336) [phone] ?https://www.self-help.org/locations/-branch ?**Offers credit-building and banking services to people unable to use traditional banking ? ? ? ? ? ? ?

## 2020-07-13 DIAGNOSIS — G40219 Localization-related (focal) (partial) symptomatic epilepsy and epileptic syndromes with complex partial seizures, intractable, without status epilepticus: Secondary | ICD-10-CM | POA: Diagnosis not present

## 2020-07-13 DIAGNOSIS — R200 Anesthesia of skin: Secondary | ICD-10-CM | POA: Diagnosis not present

## 2020-07-13 DIAGNOSIS — Z3A20 20 weeks gestation of pregnancy: Secondary | ICD-10-CM | POA: Diagnosis not present

## 2020-07-14 ENCOUNTER — Other Ambulatory Visit: Payer: Self-pay

## 2020-07-14 ENCOUNTER — Ambulatory Visit (INDEPENDENT_AMBULATORY_CARE_PROVIDER_SITE_OTHER): Payer: Medicaid Other | Admitting: Family Medicine

## 2020-07-14 VITALS — BP 107/72 | HR 86 | Wt 147.0 lb

## 2020-07-14 DIAGNOSIS — R569 Unspecified convulsions: Secondary | ICD-10-CM

## 2020-07-14 DIAGNOSIS — O0990 Supervision of high risk pregnancy, unspecified, unspecified trimester: Secondary | ICD-10-CM

## 2020-07-14 DIAGNOSIS — F419 Anxiety disorder, unspecified: Secondary | ICD-10-CM

## 2020-07-14 DIAGNOSIS — F32A Depression, unspecified: Secondary | ICD-10-CM

## 2020-07-14 DIAGNOSIS — Z148 Genetic carrier of other disease: Secondary | ICD-10-CM

## 2020-07-14 DIAGNOSIS — O09522 Supervision of elderly multigravida, second trimester: Secondary | ICD-10-CM

## 2020-07-14 MED ORDER — SERTRALINE HCL 50 MG PO TABS: 50.0000 mg | ORAL_TABLET | Freq: Every day | ORAL | 1 refills | Status: DC

## 2020-07-14 NOTE — Patient Instructions (Signed)

## 2020-07-14 NOTE — Progress Notes (Signed)
   PRENATAL VISIT NOTE  Subjective:  Jane Tran is a 35 y.o. G1P0 at [redacted]w[redacted]d being seen today for ongoing prenatal care.  She is currently monitored for the following issues for this high-risk pregnancy and has Supervision of high risk pregnancy, antepartum; Anxiety; AMA (advanced maternal age) multigravida 35+; Trichomonosis; Abnormal brain MRI; Syncopal episodes; Carrier of spinal muscular atrophy; Anxiety and depression; Seizures (HCC); Tobacco abuse; and Marijuana abuse on their problem list.  Patient reports  depression and anxiety .  Contractions: Not present. Vag. Bleeding: None.  Movement: Present. Denies leaking of fluid.   The following portions of the patient's history were reviewed and updated as appropriate: allergies, current medications, past family history, past medical history, past social history, past surgical history and problem list.   Objective:   Vitals:   07/14/20 0912  BP: 107/72  Pulse: 86  Weight: 147 lb (66.7 kg)    Fetal Status: Fetal Heart Rate (bpm): 152   Movement: Present     General:  Alert, oriented and cooperative. Patient is in no acute distress.  Skin: Skin is warm and dry. No rash noted.   Cardiovascular: Normal heart rate noted  Respiratory: Normal respiratory effort, no problems with respiration noted  Abdomen: Soft, gravid, appropriate for gestational age.  Pain/Pressure: Absent     Pelvic: Cervical exam deferred        Extremities: Normal range of motion.  Edema: None  Mental Status: Normal mood and affect. Normal behavior. Normal judgment and thought content.   Assessment and Plan:  Pregnancy: G1P0 at [redacted]w[redacted]d 1. Supervision of high risk pregnancy, antepartum Continue prenatal care.   2. Multigravida of advanced maternal age in second trimester Low risk NIPT  3. Carrier of spinal muscular atrophy   4. Seizures (HCC) On Keppra, no recent activity--this is helping her panic attacks some  5. Anxiety and depression Begin Zoloft.  R/B/A including risks of PPH in infant reviewed. - sertraline (ZOLOFT) 50 MG tablet; Take 1 tablet (50 mg total) by mouth daily. Take 1/2 tablet for 5-7 days  Dispense: 30 tablet; Refill: 1  General obstetric precautions including but not limited to vaginal bleeding, contractions, leaking of fluid and fetal movement were reviewed in detail with the patient. Please refer to After Visit Summary for other counseling recommendations.   Return in 4 weeks (on 08/11/2020).  Future Appointments  Date Time Provider Department Center  07/29/2020  2:30 PM Physician [facility] CCC-MM CARE MANAGER [facility] None  07/30/2020  2:15 PM [facility] NURSE [facility]  07/30/2020  2:30 PM [facility] US3 [facility]  08/03/2020 10:45 AM [facility]-BEHAVIORAL HEALTH CLINICIAN [facility]    [name], MD

## 2020-07-20 NOTE — BH Specialist Note (Signed)
Integrated Behavioral Health via Telemedicine Visit  07/20/2020 Taneshia Lorence [id number]  Number of Integrated Behavioral Health visits: 4 Session Start time: 10:45  Session End time: 11:52 Total time:  69  Referring Provider: Nettie Elm, MD Patient/Family location: Home [facility] Provider location: [facility]  All persons participating in visit: [name] and [facility] Caytlyn Evers   Types of Service: Individual psychotherapy and Video visit  I connected with [name] and/or [name]  n/a  via  Telephone or Video Enabled Telemedicine Application  (Video is Caregility application) and verified that I am speaking with the correct person using two identifiers. Discussed confidentiality: Yes   I discussed the limitations of telemedicine and the availability of in person appointments.  Discussed there is a possibility of technology failure and discussed alternative modes of communication if that failure occurs.  I discussed that engaging in this telemedicine visit, they consent to the provision of behavioral healthcare and the services will be billed under their insurance.  Patient and/or legal guardian expressed understanding and consented to Telemedicine visit: Yes   Presenting Concerns: Patient and/or family reports the following symptoms/concerns: Pt states her primary concern today is feeling scared about unknowns in childbirth with high risk pregnancy (hypertension, heart issues, seizures, cervix issues); since starting Zoloft, "not as stressed, not really worrying as much, no outbursts, and no crying". Pt's goal is to prepare for baby as much as possible.    Duration of problem: current pregnancy; Severity of problem:  moderately severe  Patient and/or Family's Strengths/Protective Factors: Social connections, Concrete supports in place (healthy food, safe environments, etc.), Sense of purpose, and Physical  Health (exercise, healthy diet, medication compliance, etc.)  Goals Addressed: Patient will:  Reduce symptoms of: anxiety, depression, mood instability, and stress   Increase knowledge and/or ability of: stress reduction   Demonstrate ability to: Increase healthy adjustment to current life circumstances  Progress towards Goals: Ongoing  Interventions: Interventions utilized:  Solution-Focused Strategies Standardized Assessments completed: Not Needed  Patient and/or Family Response: Pt agrees with ongoing treatment plan; taking Zoloft as prescribed  Assessment: Patient currently experiencing MDD, Anxiety disorder, Psychosocial stress.   Patient may benefit from continued psychoeducation and brief therapeutic interventions regarding coping with symptoms of anxiety, depression, life stress .  Plan: Follow up with behavioral health clinician on : One month; Call [name] as needed prior to scheduled visit at [phone] Behavioral recommendations:  -Continue taking Zoloft, prenatal vitamin, and other medications as prescribed - Register for childbirth education class of choice -Read through Postpartum Planner (on After Visit Summary) -Continue working on household projects as finances and energy level permits  Referral(s): Integrated [employer] (In Clinic)  I discussed the assessment and treatment plan with the patient and/or parent/guardian. They were provided an opportunity to ask questions and all were answered. They agreed with the plan and demonstrated an understanding of the instructions.   They were advised to call back or seek an in-person evaluation if the symptoms worsen or if the condition fails to improve as anticipated.  Valetta Close Greer Koeppen, LCSW

## 2020-07-23 ENCOUNTER — Telehealth: Payer: Self-pay | Admitting: Family Medicine

## 2020-07-23 NOTE — Telephone Encounter (Signed)
Patient discussed in Red Chart Rounds yesterday. Case reviewed with all present, including MFM, and agreed that patient should be offered MRI with contrast after appropriate counseling per ACOG recommendations, especially given that malignancy is on the differential.  I had sent a message after rounds yesterday to discuss this recommendation via Epic with her Neurologist Dr. Logan Bores. I spoke with Dr. Logan Bores from [facility] after he reached out to our clinic today. We discussed the recommendation. At present, he suggested repeating a non-contrast MRI, as some of the concerning areas seen on the previous MRI could have been due to her seizure disorder which is now under better control. If there remains clinical concern for malignancy we can then counsel her on risks of gadolinium in pregnancy and offer contrast MRI. I agreed this was a good path forward.   FYI to Dr. Jolayne Panther who will see the patient next.

## 2020-07-26 DIAGNOSIS — I472 Ventricular tachycardia: Secondary | ICD-10-CM | POA: Diagnosis not present

## 2020-07-29 ENCOUNTER — Other Ambulatory Visit: Payer: Self-pay | Admitting: *Deleted

## 2020-07-29 NOTE — Patient Outreach (Signed)
Care Coordination  07/29/2020  Mekaila Tarnow [birth year] [id number]   Medicaid Managed Care   Unsuccessful Outreach Note  07/29/2020 Name: Norrine Ballester MRN: [id number] DOB: [birth year]  Referred by: Havery Moros, MD Reason for referral : High Risk Managed Medicaid (Unsuccessful RNCM follow up outreach)   An unsuccessful telephone outreach was attempted today. The patient was referred to the case management team for assistance with care management and care coordination.   Follow Up Plan: A HIPAA compliant phone message was left for the patient providing contact information and requesting a return call.   Estanislado Emms RN, BSN [place]  [facility]

## 2020-07-29 NOTE — Patient Instructions (Signed)
Visit Information  Ms. Jane Tran  - as a part of your Medicaid benefit, you are eligible for care management and care coordination services at no cost or copay. I was unable to reach you by phone today but would be happy to help you with your health related needs. Please feel free to call me @ [phone].   A member of the Managed Medicaid care management team will reach out to you again over the next 7-14 days on 08/09/20 @ 10:30am.   Estanislado Emms RN, BSN [place]  [facility] RN Care Coordinator

## 2020-07-30 ENCOUNTER — Ambulatory Visit: Payer: Medicaid Other | Admitting: *Deleted

## 2020-07-30 ENCOUNTER — Other Ambulatory Visit: Payer: Self-pay

## 2020-07-30 ENCOUNTER — Encounter: Payer: Self-pay | Admitting: *Deleted

## 2020-07-30 ENCOUNTER — Ambulatory Visit: Payer: Medicaid Other | Attending: Obstetrics

## 2020-07-30 ENCOUNTER — Other Ambulatory Visit: Payer: Self-pay | Admitting: *Deleted

## 2020-07-30 VITALS — BP 105/54 | HR 76

## 2020-07-30 DIAGNOSIS — Z3A23 23 weeks gestation of pregnancy: Secondary | ICD-10-CM

## 2020-07-30 DIAGNOSIS — O0990 Supervision of high risk pregnancy, unspecified, unspecified trimester: Secondary | ICD-10-CM | POA: Insufficient documentation

## 2020-07-30 DIAGNOSIS — G40909 Epilepsy, unspecified, not intractable, without status epilepticus: Secondary | ICD-10-CM | POA: Insufficient documentation

## 2020-07-30 DIAGNOSIS — O09512 Supervision of elderly primigravida, second trimester: Secondary | ICD-10-CM | POA: Diagnosis not present

## 2020-07-30 DIAGNOSIS — Z362 Encounter for other antenatal screening follow-up: Secondary | ICD-10-CM | POA: Diagnosis not present

## 2020-07-30 DIAGNOSIS — O99352 Diseases of the nervous system complicating pregnancy, second trimester: Secondary | ICD-10-CM | POA: Insufficient documentation

## 2020-07-30 DIAGNOSIS — A599 Trichomoniasis, unspecified: Secondary | ICD-10-CM

## 2020-07-30 DIAGNOSIS — O09529 Supervision of elderly multigravida, unspecified trimester: Secondary | ICD-10-CM

## 2020-08-03 ENCOUNTER — Ambulatory Visit: Payer: Medicaid Other | Admitting: Clinical

## 2020-08-03 DIAGNOSIS — Z658 Other specified problems related to psychosocial circumstances: Secondary | ICD-10-CM

## 2020-08-03 DIAGNOSIS — F331 Major depressive disorder, recurrent, moderate: Secondary | ICD-10-CM

## 2020-08-03 DIAGNOSIS — F419 Anxiety disorder, unspecified: Secondary | ICD-10-CM

## 2020-08-03 NOTE — Patient Instructions (Signed)
Center for [facility] [place], [place] [address] [phone] (main office) [phone] [facility] office)  [email or web address] (register for childbirth education classes)     BRAINSTORMING  Develop a Plan Goals: Provide a way to start conversation about your new life with a baby Assist parents in recognizing and using resources within their reach Help pave the way before birth for an easier period of transition afterwards.  Make a list of the following information to keep in a central location: Full name of Mom and Partner: _____________________________________________ 57 full name and Date of Birth: ___________________________________________ Home Address: ___________________________________________________________ ________________________________________________________________________ Home Phone: ____________________________________________________________ Parents' cell numbers: _____________________________________________________ ________________________________________________________________________ Name and contact info for OB: ______________________________________________ Name and contact info for Pediatrician:________________________________________ Contact info for Lactation Consultants: ________________________________________  REST and SLEEP *You each need at least 4-5 hours of uninterrupted sleep every day. Write specific names and contact information.* How are you going to rest in the postpartum period? While partner's home? When partner returns to work? When you both return to work? Where will your baby sleep? Who is available to help during the day? Evening? Night? Who could move in for a period to help support you? What are some ideas to help you get enough  sleep? __________________________________________________________________________________________________________________________________________________________________________________________________________________________________________ NUTRITIOUS FOOD AND DRINK *Plan for meals before your baby is born so you can have healthy food to eat during the immediate postpartum period.* Who will look after breakfast? Lunch? Dinner? List names and contact information. Brainstorm quick, healthy ideas for each meal. What can you do before baby is born to prepare meals for the postpartum period? How can others help you with meals? Which grocery stores provide online shopping and delivery? Which restaurants offer take-out or delivery options? ______________________________________________________________________________________________________________________________________________________________________________________________________________________________________________________________________________________________________________________________________________________________________________________________________  CARE FOR MOM *It's important that mom is cared for and pampered in the postpartum period. Remember, the most important ways new mothers need care are: sleep, nutrition, gentle exercise, and time off.* Who can come take care of mom during this period? Make a list of people with their contact information. List some activities that make you feel cared for, rested, and energized? Who can make sure you have opportunities to do these things? Does mom have a space of her very own within your home that's just for her? Make a "[facility]" where she can be comfortable, rest, and renew herself  daily. ______________________________________________________________________________________________________________________________________________________________________________________________________________________________________________________________________________________________________________________________________________________________________________________________________    CARE FOR AND FEEDING BABY *Knowledgeable and encouraging people will offer the best support with regard to feeding your baby.* Educate yourself and choose the best feeding option for your baby. Make a list of people who will guide, support, and be a resource for you as your care for and feed your baby. (Friends that have breastfed or are currently breastfeeding, lactation consultants, breastfeeding support groups, etc.) Consider a postpartum doula. (These websites can give you information: dona.org & BuyingShow.es) Seek out local breastfeeding resources like the breastfeeding support group at [employer] or [employer]. ______________________________________________________________________________________________________________________________________________________________________________________________________________________________________________________________________________________________________________________________________________________________________________________________________  Verner Chol AND ERRANDS Who can help with a thorough cleaning before baby is born? Make a list of people who will help with housekeeping and chores, like laundry, light cleaning, dishes, bathrooms, etc. Who can run some errands for you? What can you do to make sure you are stocked with basic supplies before baby is born? Who is going to do the  shopping? ______________________________________________________________________________________________________________________________________________________________________________________________________________________________________________________________________________________________________________________________________________________________________________________________________     Family Adjustment *Nurture yourselves.it helps parents be more loving and allows for better bonding with their child.* What sorts  of things do you and partner enjoy doing together? Which activities help you to connect and strengthen your relationship? Make a list of those things. Make a list of people whom you trust to care for your baby so you can have some time together as a couple. What types of things help partner feel connected to Mom? Make a list. What needs will partner have in order to bond with baby? Other children? Who will care for them when you go into labor and while you are in the hospital? Think about what the needs of your older children might be. Who can help you meet those needs? In what ways are you helping them prepare for bringing baby home? List some specific strategies you have for family adjustment. _______________________________________________________________________________________________________________________________________________________________________________________________________________________________________________________________________________________________________________________________________________  SUPPORT *Someone who can empathize with experiences normalizes your problems and makes them more bearable.* Make a list of other friends, neighbors, and/or co-workers you know with infants (and small children, if applicable) with whom you can connect. Make a list of local or online support groups, mom groups, etc. in which you can be  involved. ______________________________________________________________________________________________________________________________________________________________________________________________________________________________________________________________________________________________________________________________________________________________________________________________________  Childcare Plans Investigate and plan for childcare if mom is returning to work. Talk about mom's concerns about her transition back to work. Talk about partner's concerns regarding this transition.  Mental Health *Your mental health is one of the highest priorities for a pregnant or postpartum mom.* 1 in 5 women experience anxiety and/or depression from the time of conception through the first year after birth. Postpartum Mood Disorders are the #1 complication of pregnancy and childbirth and the suffering experienced by these mothers is not necessary! These illnesses are temporary and respond well to treatment, which often includes self-care, social support, talk therapy, and medication when needed. Women experiencing anxiety and depression often say things like: "I'm supposed to be happy.why do I feel so sad?", "Why can't I snap out of it?", "I'm having thoughts that scare me." There is no need to be embarrassed if you are feeling these symptoms: Overwhelmed, anxious, angry, sad, guilty, irritable, hopeless, exhausted but can't sleep You are NOT alone. You are NOT to blame. With help, you WILL be well. Where can I find help? Medical professionals such as your OB, midwife, gynecologist, family practitioner, primary care provider, pediatrician, or mental health providers; [facility] support groups: Feelings After Birth, Breastfeeding Support Group, Baby and Me Group, and Fit 4 Two exercise classes. You have permission to ask for help. It will confirm your feelings, validate your experiences,  share/learn coping strategies, and gain support and encouragement as you heal. You are important! BRAINSTORM Make a list of local resources, including resources for mom and for partner. Identify support groups. Identify people to call late at night - include names and contact info. Talk with partner about perinatal mood and anxiety disorders. Talk with your OB, midwife, and doula about baby blues and about perinatal mood and anxiety disorders. Talk with your pediatrician about perinatal mood and anxiety disorders.   Support & Sanity Savers   What do you really need?  Basics In preparing for a new baby, many expectant parents spend hours shopping for baby clothes, decorating the nursery, and deciding which car seat to buy. Yet most don't think much about what the reality of parenting a newborn will be like, and what they need to make it through that. So, here is the advice of experienced parents. We know you'll read this, and think "they're exaggerating, I don't really need that." Just trust [place]  on these, OK? Plan for all of this, and if it turns out you don't need it, come back and teach [place] how you did it!  Must-Haves (Once baby's survival needs are met, make sure you attend to your own survival needs!) Sleep An average newborn sleeps 16-18 hours per day, over 6-7 sleep periods, rarely more than three hours at a time. It is normal and healthy for a newborn to wake throughout the night... but really hard on parents!! Naps. Prioritize sleep above any responsibilities like: cleaning house, visiting friends, running errands, etc.  Sleep whenever baby sleeps. If you can't nap, at least have restful times when baby eats. The more rest you get, the more patient you will be, the more emotionally stable, and better at solving problems.  Food You may not have realized it would be difficult to eat when you have a newborn. Yet, when we talk to countless new parents, they say things like "it may be 2:00 pm  when I realize I haven't had breakfast yet." Or "every time we sit down to dinner, baby needs to eat, and my food gets cold, so I don't bother to eat it." Finger food. Before your baby is born, stock up with one months' worth of food that: 1) you can eat with one hand while holding a baby, 2) doesn't need to be prepped, 3) is good hot or cold, 4) doesn't spoil when left out for a few hours, and 5) you like to eat. Think about: nuts, dried fruit, Clif bars, pretzels, jerky, gogurt, baby carrots, apples, bananas, crackers, cheez-n-crackers, string cheese, hot pockets or frozen burritos to microwave, garden burgers and breakfast pastries to put in the toaster, yogurt drinks, etc. Restaurant Menus. Make lists of your favorite restaurants & menu items. When family/friends want to help, you can give specific information without much thought. They can either bring you the food or send gift cards for just the right meals. Freezer Meals.  Take some time to make a few meals to put in the freezer ahead of time.  Easy to freeze meals can be anything such as soup, lasagna, chicken pie, or spaghetti sauce. Set up a Meal Schedule.  Ask friends and family to sign up to bring you meals during the first few weeks of being home. (It can be passed around at baby showers!) You have no idea how helpful this will be until you are in the throes of parenting.  https://hamilton-woodard.com/ is a great website to check out. Emotional Support Know who to call when you're stressed out. Parenting a newborn is very challenging work. There are times when it totally overwhelms your normal coping abilities. EVERY NEW PARENT NEEDS TO HAVE A PLAN FOR WHO TO CALL WHEN THEY JUST CAN'T COPE ANY MORE. (And it has to be someone other than the baby's other parent!) Before your baby is born, come up with at least one person you can call for support - write their phone number down and post it on the refrigerator. Anxiety & Sadness. Baby blues are normal after  pregnancy; however, there are more severe types of anxiety & sadness which can occur and should not be ignored.  They are always treatable, but you have to take the first step by reaching out for help. [facility] offers a "Mom Talk" group which meets every Tuesday from 10 am - 11 am.  This group is for new moms who need support and connection after their babies are born.  Call [phone].  Really, Really Helpful (Plan for them! Make sure these happen often!!) Physical Support with Taking Care of Yourselves Asking friends and family. Before your baby is born, set up a schedule of people who can come and visit and help out (or ask a friend to schedule for you). Any time someone says "let me know what I can do to help," sign them up for a day. When they get there, their job is not to take care of the baby (that's your job and your joy). Their job is to take care of you!  Postpartum doulas. If you don't have anyone you can call on for support, look into postpartum doulas:  professionals at helping parents with caring for baby, caring for themselves, getting breastfeeding started, and helping with household tasks. www.padanc.org is a helpful website for learning about doulas in our area. Peer Support / Parent Groups Why: One of the greatest ideas for new parents is to be around other new parents. Parent groups give you a chance to share and listen to others who are going through the same season of life, get a sense of what is normal infant development by watching several babies learn and grow, share your stories of triumph and struggles with empathetic ears, and forgive your own mistakes when you realize all parents are learning by trial and error. Where to find: There are many places you can meet other new parents throughout our community.  [facility] offers the following classes for new moms and their little ones:  Baby and Me (Birth to [place]) and Breastfeeding Support Group. Go to  www.conehealthybaby.com or call [phone] for more information. Time for your Relationship It's easy to get so caught up in meeting baby's immediate needs that it's hard to find time to connect with your partner, and meet the needs of your relationship. It's also easy to forget what "quality time with your partner" actually looks like. If you take your baby on a date, you'd be amazed how much of your couple time is spent feeding the baby, diapering the baby, admiring the baby, and talking about the baby. Dating: Try to take time for just the two of you. Babysitter tip: Sometimes when moms are breastfeeding a newborn, they find it hard to figure out how to schedule outings around baby's unpredictable feeding schedules. Have the babysitter come for a three hour period. When she comes over, if baby has just eaten, you can leave right away, and come back in two hours. If baby hasn't fed recently, you start the date at home. Once baby gets hungry and gets a good feeding in, you can head out for the rest of your date time. Date Nights at Home: If you can't get out, at least set aside one evening a week to prioritize your relationship: whenever baby dozes off or doesn't have any immediate needs, spend a little time focusing on each other. Potential conflicts: The main relationship conflicts that come up for new parents are: issues related to sexuality, financial stresses, a feeling of an unfair division of household tasks, and conflicts in parenting styles. The more you can work on these issues before baby arrives, the better!  Fun and Frills (Don't forget these. and don't feel guilty for indulging in them!) Everyone has something in life that is a fun little treat that they do just for themselves. It may be: reading the morning paper, or going for a daily jog, or having coffee with a friend once a week, or going  to a movie on Friday nights, or fine chocolates, or bubble baths, or curling up with a good  book. Unless you do fun things for yourself every now and then, it's hard to have the energy for fun with your baby. Whatever your "special" treats are, make sure you find a way to continue to indulge in them after your baby is born. These special moments can recharge you, and allow you to return to baby with a new joy   PERINATAL MOOD DISORDERS: [place]   Emergency and Crisis Resources:  If you are an imminent risk to self or others, are experiencing intense personal distress, and/or have noticed significant changes in activities of daily living, call:  [facility]: [place]: [place]: [phone] Or visit the following crisis centers: Local Emergency Departments [facility]: [address], [place]. Hours: 8:30AM-5PM. Insurance Accepted: Medicaid, Medicare, and Uninsured.  [facility]  [address], [place] Mon-Friday 8am-3pm  [phone]                                                                                    Non-Crisis Resources: To identify specific providers that are covered by your insurance, contact your insurance company or local agencies: [place]: [phone] [facility] and [place]: [phone] [place]: [phone] Postpartum Support International- [place] 1-([phone]                                                      Outpatient therapy and medication management providers:  [facility] [phone] Hours: 9AM-5PM  Insurance Accepted: Alben Spittle, Lorella Nimrod, Freddrick March, [place], Medicare  [facility] ([place]) [phone] Hours: 8AM-5PM  nsurance Accepted: All insurances EXCEPT AARP, [place], [place], and [place]: [phone]             Hours: 8AM-8PM Insurance  Accepted: Cristal Ford, Freddrick March, [place], Medicare, [phone] [facility]: [phone] Hours: 8:30AM-7PM Insurance Accepted: Cristal Ford, Medicaid, Medicare, Tricare, [employer] Counseling:  [place] Accepted:  Holland Falling, Lorella Nimrod, [employer], [place], [facility] [phone] Hours: 9AM-5:30PM Insurance Accepted: Alben Spittle, [place], and Medicaid, Medicare, [employer] [facility] Counseling:  [phone] Hours: 9am-5pm Insurance Accepted: BCBS; they do not accept Medicaid/Medicare [place]: [phone] Hours: 9am-9pm Insurance Accepted: All major insurance including Medicaid and Medicare [facility]: [phone] Hours: 9AM-5:30PM Insurance Accepted: All insurances EXCEPT Medicaid and Medicare. [facility]: [phone]  Parenting [place]: [phone] [facility]:  [place] (support for children in the NICU and/or with special needs), [place] [employer]: [phone]                                                                                     Online Resources: Postpartum Support International: http://jones-berg.com/  800-944-4PPD 2Moms Supporting Moms:  www.momssupportingmoms.net

## 2020-08-09 ENCOUNTER — Other Ambulatory Visit: Payer: Self-pay | Admitting: *Deleted

## 2020-08-09 NOTE — Patient Instructions (Signed)
Visit Information  Ms. [name]  - as a part of your Medicaid benefit, you are eligible for care management and care coordination services at no cost or copay. I was unable to reach you by phone today but would be happy to help you with your health related needs. Please feel free to call me @ [phone].   A member of the Managed Medicaid care management team will reach out to you again over the next 21 days.   Estanislado Emms RN, BSN [place]  [facility] [employer]

## 2020-08-09 NOTE — Patient Outreach (Signed)
Care Coordination  08/09/2020  Jane Tran May 31, 1985 [id number]   Medicaid Managed Care   Unsuccessful Outreach Note  08/09/2020 Name: Jane Tran MRN: [id number] DOB: Dec 29, 1985  Referred by: Havery Moros, MD Reason for referral : High Risk Managed Medicaid (Unsuccessful RNCM follow up outreach)   A second unsuccessful telephone outreach was attempted today. The patient was referred to the case management team for assistance with care management and care coordination.   Follow Up Plan: A HIPAA compliant phone message was left for the patient providing contact information and requesting a return call.   Estanislado Emms RN, BSN [place]  [employer]

## 2020-08-11 ENCOUNTER — Other Ambulatory Visit: Payer: Self-pay

## 2020-08-11 ENCOUNTER — Ambulatory Visit (INDEPENDENT_AMBULATORY_CARE_PROVIDER_SITE_OTHER): Payer: Medicaid Other | Admitting: Obstetrics and Gynecology

## 2020-08-11 ENCOUNTER — Encounter: Payer: Self-pay | Admitting: Obstetrics and Gynecology

## 2020-08-11 VITALS — BP 109/76 | HR 97 | Wt 149.6 lb

## 2020-08-11 DIAGNOSIS — R569 Unspecified convulsions: Secondary | ICD-10-CM

## 2020-08-11 DIAGNOSIS — R9089 Other abnormal findings on diagnostic imaging of central nervous system: Secondary | ICD-10-CM

## 2020-08-11 DIAGNOSIS — F32A Depression, unspecified: Secondary | ICD-10-CM

## 2020-08-11 DIAGNOSIS — O0990 Supervision of high risk pregnancy, unspecified, unspecified trimester: Secondary | ICD-10-CM

## 2020-08-11 DIAGNOSIS — F419 Anxiety disorder, unspecified: Secondary | ICD-10-CM

## 2020-08-11 NOTE — Progress Notes (Signed)
   PRENATAL VISIT NOTE  Subjective:  Jane Tran is a 35 y.o. G1P0 at [redacted]w[redacted]d being seen today for ongoing prenatal care.  She is currently monitored for the following issues for this high-risk pregnancy and has Supervision of high risk pregnancy, antepartum; Anxiety; AMA (advanced maternal age) multigravida 35+; Trichomonosis; Abnormal brain MRI; Syncopal episodes; Carrier of spinal muscular atrophy; Anxiety and depression; Seizures (HCC); Tobacco abuse; and Marijuana abuse on their problem list.  Patient reports no complaints.  Contractions: Not present. Vag. Bleeding: None.  Movement: Present. Denies leaking of fluid.   The following portions of the patient's history were reviewed and updated as appropriate: allergies, current medications, past family history, past medical history, past social history, past surgical history and problem list.   Objective:   Vitals:   08/11/20 1025  BP: 109/76  Pulse: 97  Weight: 149 lb 9.6 oz (67.9 kg)    Fetal Status: Fetal Heart Rate (bpm): 145 Fundal Height: 26 cm Movement: Present     General:  Alert, oriented and cooperative. Patient is in no acute distress.  Skin: Skin is warm and dry. No rash noted.   Cardiovascular: Normal heart rate noted  Respiratory: Normal respiratory effort, no problems with respiration noted  Abdomen: Soft, gravid, appropriate for gestational age.  Pain/Pressure: Absent     Pelvic: Cervical exam deferred        Extremities: Normal range of motion.  Edema: Trace  Mental Status: Normal mood and affect. Normal behavior. Normal judgment and thought content.   Assessment and Plan:  Pregnancy: G1P0 at [redacted]w[redacted]d 1. Supervision of high risk pregnancy, antepartum Patient is doing well without complaints Third trimester labs next visit with glucola  2. Abnormal brain MRI Followed by Neurologist Is also followed by a cardiologist  3. Seizures (HCC) Stable Continue Keppra  4. Anxiety and depression Stable  Continue  Zoloft  Preterm labor symptoms and general obstetric precautions including but not limited to vaginal bleeding, contractions, leaking of fluid and fetal movement were reviewed in detail with the patient. Please refer to After Visit Summary for other counseling recommendations.   Return in about 4 weeks (around 09/08/2020) for in person, ROB, High risk, 2 hr glucola next visit.  Future Appointments  Date Time Provider Department Center  09/01/2020  3:00 PM [facility] CCC-MM CARE MANAGER [facility] None  09/07/2020 10:45 AM [facility]AVIOR[facility]ALTH CLINICIAN [facility]  10/01/2020 10:45 AM [facility] NURSE [facility]  10/01/2020 11:00 AM [facility] US1 [facility]    Catalina Antigua, MD

## 2020-08-21 ENCOUNTER — Other Ambulatory Visit: Payer: Self-pay | Admitting: Family Medicine

## 2020-08-21 DIAGNOSIS — F32A Depression, unspecified: Secondary | ICD-10-CM

## 2020-08-21 DIAGNOSIS — F419 Anxiety disorder, unspecified: Secondary | ICD-10-CM

## 2020-08-24 NOTE — BH Specialist Note (Signed)
Integrated Behavioral Health via Telemedicine Visit  08/24/2020 Jane Tran [id number]  Number of Integrated Behavioral Health visits: 5 Session Start time: 10:48  Session End time: 11:47 Total time:  37  Referring Provider: Nettie Elm, MD Patient/Family location: Home [place] Provider location: [facility]  All persons participating in visit: [name] and Jane Tran Jane Tran   Types of Service: Individual psychotherapy and Video visit  I connected with Jane Tran and/or Jane Tran  n/a  via  Telephone or Video Enabled Telemedicine Application  (Video is Caregility application) and verified that I am speaking with the correct person using two identifiers. Discussed confidentiality: Yes   I discussed the limitations of telemedicine and the availability of in person appointments.  Discussed there is a possibility of technology failure and discussed alternative modes of communication if that failure occurs.  I discussed that engaging in this telemedicine visit, they consent to the provision of behavioral healthcare and the services will be billed under their insurance.  Patient and/or legal guardian expressed understanding and consented to Telemedicine visit: Yes   Presenting Concerns: Patient and/or family reports the following symptoms/concerns: Pt states her primary concern today is an increase in panic attacks, poor appetite, and "snapping at everyone" this week, and increase in cigarette use, attributed to stress of major car issues while out-of-state and flea infestation. Pt would like to try switching Zoloft to taking in morning instead of evening to see if this helps, now that she's no longer experiencing nausea.  Duration of problem: Current pregnancy; Severity of problem:  moderately severe  Patient and/or Family's Strengths/Protective Factors: Social connections, Concrete supports in place (healthy  food, safe environments, etc.), Sense of purpose, and Physical Health (exercise, healthy diet, medication compliance, etc.)  Goals Addressed: Patient will:  Reduce symptoms of: anxiety, depression, mood instability, and stress   Demonstrate ability to: Increase healthy adjustment to current life circumstances  Progress towards Goals: Ongoing  Interventions: Interventions utilized:  Medication Monitoring and Supportive Reflection Standardized Assessments completed: Not Needed  Patient and/or Family Response: Pt agrees with treatment plan  Assessment: Patient currently experiencing MDD, Anxiety disorder; Psychosocial stress.   Patient may benefit from continued psychoeducation and brief therapeutic interventions regarding coping with symptoms of anxiety, depression, mood instability, stress .  Plan: Follow up with behavioral health clinician on : One month; Call Jane Tran as needed at [phone] Behavioral recommendations:  -Continue taking medications as prescribed -Continue plan to register for childbirth education class of choice at [email or web address] -Continue plan to repair car radiator,being mindful of safety; bathe dog by carefully getting him in tub (lure him in, don't pick him up, too heavy) -Be kind to yourself during very stressful days. (Remind yourself you're doing the best you can in the moment.) Referral(s): Integrated [employer] (In Clinic)  I discussed the assessment and treatment plan with the patient and/or parent/guardian. They were provided an opportunity to ask questions and all were answered. They agreed with the plan and demonstrated an understanding of the instructions.   They were advised to call back or seek an in-person evaluation if the symptoms worsen or if the condition fails to improve as anticipated.  Jane Tran Jane Subramaniam, LCSW

## 2020-08-31 DIAGNOSIS — R55 Syncope and collapse: Secondary | ICD-10-CM | POA: Diagnosis not present

## 2020-08-31 DIAGNOSIS — G4719 Other hypersomnia: Secondary | ICD-10-CM | POA: Diagnosis not present

## 2020-08-31 DIAGNOSIS — F17219 Nicotine dependence, cigarettes, with unspecified nicotine-induced disorders: Secondary | ICD-10-CM | POA: Diagnosis not present

## 2020-08-31 DIAGNOSIS — G40219 Localization-related (focal) (partial) symptomatic epilepsy and epileptic syndromes with complex partial seizures, intractable, without status epilepticus: Secondary | ICD-10-CM | POA: Diagnosis not present

## 2020-08-31 DIAGNOSIS — R0683 Snoring: Secondary | ICD-10-CM | POA: Diagnosis not present

## 2020-08-31 DIAGNOSIS — R002 Palpitations: Secondary | ICD-10-CM | POA: Diagnosis not present

## 2020-08-31 DIAGNOSIS — Z3A27 27 weeks gestation of pregnancy: Secondary | ICD-10-CM | POA: Diagnosis not present

## 2020-09-01 ENCOUNTER — Other Ambulatory Visit: Payer: Self-pay

## 2020-09-01 ENCOUNTER — Other Ambulatory Visit: Payer: Self-pay | Admitting: *Deleted

## 2020-09-01 NOTE — Patient Instructions (Signed)
Visit Information  Jane Tran was given information about Medicaid Managed Care team care coordination services as a part of their [facility] Community [facility] Medicaid benefit. Jane Tran verbally consented to engagement with the [facility] Managed Care team.   If you are experiencing a medical emergency, please call 911 or report to your local emergency department or urgent care.   If you have a non-emergency medical problem during routine business hours, please contact your provider's office and ask to speak with a nurse.   For questions related to your [facility], please call: [phone] or visit the homepage here: [email or web address]  If you would like to schedule transportation through your [facility] please call the following number at least 2 days in advance of your appointment: [phone].   Call the Behavioral Health Crisis Line at [phone], at any time, 24 hours a day, 7 days a week. If you are in danger or need immediate medical attention call 911.  If you would like help to quit smoking, call 1-800-QUIT-NOW (847-660-8918) OR Espaol: 1-855-Djelo-Ya (6-629-476-5465) o para ms informacin haga clic aqu or Text READY to 035-465 to register via text  Jane Tran - following are the goals we discussed in your visit today:   Goals Addressed             This Visit's Progress    Find Help in My Community       Timeframe:  Long-Range Goal Priority:  High Start Date:    06/25/20                         Expected End Date:   12/25/20                    Follow Up Date 09/29/20    - contact the Medicaid Enrollment Broker [phone] for questions re: enrolling for Medicaid after delivery - work with MM Pharmacist, Jane Tran for medication management - begin a notebook of services in my neighborhood or community - follow-up on any referrals for help I am  given - make a list of family or friends that I can call - call [employer] [phone] for available benefits for pregnancy and possible assistance with home repairs  - call [phone] 2-3 days before your next appointment to arrange for medical transportation provided by [facility]   Why is this important?   Knowing how and where to find help for yourself or family in your neighborhood and community is an important skill.  You will want to take some steps to learn how.         Make and Keep All Appointments       Timeframe:  Long-Range Goal Priority:  High Start Date:    06/25/20                         Expected End Date:  12/25/20                   Follow Up Date 09/29/20    - arrange a ride through an agency 1 week before appointment - ask family or friend for a ride - call to cancel if needed - keep a calendar with prescription refill dates - keep a calendar with appointment dates    Why is this important?   Part of staying healthy is seeing the doctor for follow-up care.  If you forget  your appointments, there are some things you can do to stay on track.    Notes:         Please see education materials related to smoking and anxiety provided by MyChart link.  Patient has access to MyChart portal  Telephone follow up appointment with Managed Medicaid care management team member scheduled for:09/29/20 @ 1pm  Jane Emms RN, BSN [place]  [facility] RN Care Coordinator   Following is a copy of your plan of care:  Patient Care Plan: General Plan of Care (Adult)     Problem Identified: Health Promotion or Disease Self-Management (General Plan of Care)      Long-Range Goal: Self-Management Plan Developed   Start Date: 06/25/2020  Expected End Date: 12/24/2020  Recent Progress: On track  Priority: High  Note:   Current Barriers:  Ineffective Self Health Maintenance-Jane Tran is being evaluated for seizures vs syncopal episodes while  being pregnant. 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 She is concerned about transportation to upcoming appointments, financial needs while making repairs to her home, obtaining medications and understanding her health while pregnant. Jane Tran is concerned about her insurance coverage after delivery and continuing her medical care with her new diagnosis.  Does not adhere to prescribed medication regimen-Patient has not been able to afford prescribed medications. Her significant other gave her the money today for medications, she plans to get them and start taking today. Currently UNABLE TO independently self manage needs related to chronic health conditions.  Knowledge Deficits related to short term plan for care coordination needs and long term plans for chronic disease management needs Nurse Case Manager Clinical Goal(s):  patient will work with care management team to address care coordination and chronic disease management needs related to Care Coordination   Interventions:  Evaluation of current treatment plan related to pregnancy and patient's adherence to plan as established by provider. Advised patient to continue to work on smoking cessation, discussed cutting back one cigarette a week until she reaches her goal Provided education to patient re: hypertension Reviewed medications with patient and discussed the importance of picking up prescriptions and taking as directed. Discussed Keppra and how important it is to take regularly, encouraged patient to set an alarm on her phone to increase compliance Discussed plans with patient for ongoing care management follow up and provided patient with direct contact information for care management team Reviewed scheduled/upcoming provider appointments including: 8/22 Placement of Loop recorder, 8/24 Virtual Visit Provided number for medical transportation provided by [facility] [phone] Encouraged patient to call [facility] for  member benefits [phone] Self Care Activities:  Patient will self administer medications as prescribed Patient will attend all scheduled provider appointments Patient will call pharmacy for medication refills Patient will call provider office for new concerns or questions Patient Goals: - contact the Medicaid Enrollment Broker [phone] for questions re: enrolling for Medicaid after delivery - work with MM Pharmacist, Jane Tran for medication management - begin a notebook of services in my neighborhood or community - follow-up on any referrals for help I am given - make a list of family or friends that I can call - call [employer] [phone] for available benefits for pregnancy and possible assistance with home repairs  - call [phone] 2-3 days before your next appointment to arrange for medical transportation provided by [facility] - arrange a ride through an agency 1 week before appointment - ask family or friend for a ride -  call to cancel if needed - keep a calendar with prescription refill dates - keep a calendar with appointment dates Follow Up Plan: Telephone follow up appointment with care management team member scheduled for:09/29/20 @ 1pm

## 2020-09-01 NOTE — Patient Outreach (Signed)
Medicaid Managed Care   Nurse Care Manager Note  09/01/2020 Name:  Jane Tran MRN:  [id number] DOB:  24-Aug-1985  [place] Spainhower is an 35 y.o. year old female who is a primary patient of [name], [name], MD.  The Medicaid Managed Care Coordination team was consulted for assistance with:    Obstetrics healthcare management needs  Jane Tran was given information about Medicaid Managed Care Coordination team services today. Jane Tran Patient agreed to services and verbal consent obtained.  Engaged with patient by telephone for follow up visit in response to provider referral for case management and/or care coordination services.   Assessments/Interventions:  Review of past medical history, allergies, medications, health status, including review of consultants reports, laboratory and other test data, was performed as part of comprehensive evaluation and provision of chronic care management services.  SDOH (Social Determinants of Health) assessments and interventions performed: SDOH Interventions    Flowsheet Row Most Recent Value  SDOH Interventions   Transportation Interventions Other (Comment)  [Provided with Jane Tran medical transportation [phone]       Care Plan  Allergies  Allergen Reactions   Albuterol Other (See Comments) and Shortness Of Breath    States it cause her to hyperventlate States it cause her to hyperventlate    Methocarbamol Other (See Comments)    States it causes her to hyperventilate   Penicillins    Tramadol     Medications Reviewed Today     Reviewed by [name], RN (Registered Nurse) on 09/01/20 at 1552  Med List Status: <None>   Medication Order Taking? Sig Documenting Provider Last Dose Status Informant  acetaminophen (TYLENOL) 500 MG tablet [id number]  Take 2 tablets (1,000 mg total) by mouth every 8 (eight) hours as needed for moderate pain.  Patient not taking: Reported on 07/14/2020   [name], [name]  Active    aspirin EC 81 MG tablet [id number] Yes Take 1 tablet (81 mg total) by mouth daily. Take after 12 weeks for prevention of preeclampsia later in pregnancy [name], MD Taking Active            Med Note [name], Dione Mccombie A   Wed Sep 01, 2020  3:44 PM)    Blood Pressure Monitoring DEVI [id number] Yes 1 each by Does not apply route once a week. [name], MD Taking Active   folic acid (FOLVITE) 1 MG tablet [id number] No Take 1 mg by mouth daily.  Patient not taking: Reported on 09/01/2020   [provider] Not Taking Active            Med Note (Azul Coffie A   Fri Jun 25, 2020 10:48 AM) Needs to pick up prescription  levETIRAcetam (KEPPRA) 500 MG tablet [id number] No Take 500 mg by mouth 2 (two) times daily.  Patient not taking: Reported on 09/01/2020   [provider] Not Taking Active            Med Note (Matisse Salais A   Wed Sep 01, 2020  3:43 PM) Ran out 4 days ago  Prenatal Vit-Fe Fumarate-FA (PRENATAL MULTIVITAMIN) TABS tablet [id number] Yes Take 1 tablet by mouth daily at 12 noon. [provider] Taking Active   sertraline (ZOLOFT) 50 MG tablet [id number] Yes TAKE 1 TABLET (50 MG TOTAL) BY MOUTH DAILY. TAKE 1/2 TABLET FOR 5-7 DAYS [name], MD Taking Active             Patient Active Problem List  Diagnosis Date Noted   Carrier of spinal muscular atrophy 06/04/2020   Trichomonosis 05/20/2020   Abnormal brain MRI 05/20/2020   Syncopal episodes 05/20/2020   Supervision of high risk pregnancy, antepartum 05/11/2020   Anxiety 05/11/2020   AMA (advanced maternal age) multigravida 35+ 05/11/2020   Seizures (HCC) 04/22/2020   Anxiety and depression 12/21/2017   Tobacco abuse 04/12/2012   Marijuana abuse 04/12/2012    Conditions to be addressed/monitored per PCP order:   Pregnancy related health management  Care Plan : General Plan of Care (Adult)  Updates made by [name], RN since 09/01/2020 12:00 AM     Problem: Health  Promotion or Disease Self-Management (General Plan of Care)      Long-Range Goal: Self-Management Plan Developed   Start Date: 06/25/2020  Expected End Date: 12/24/2020  Recent Progress: On track  Priority: High  Note:   Current Barriers:  Ineffective Self Health Maintenance-Jane Tran is being evaluated for seizures vs syncopal episodes while being pregnant. 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 She is concerned about transportation to upcoming appointments, financial needs while making repairs to her home, obtaining medications and understanding her health while pregnant. Jane Tran is concerned about her insurance coverage after delivery and continuing her medical care with her new diagnosis.  Does not adhere to prescribed medication regimen-Patient has not been able to afford prescribed medications. Her significant other gave her the money today for medications, she plans to get them and start taking today. Currently UNABLE TO independently self manage needs related to chronic health conditions.  Knowledge Deficits related to short term plan for care coordination needs and long term plans for chronic disease management needs Nurse Case Manager Clinical Goal(s):  patient will work with care management team to address care coordination and chronic disease management needs related to Care Coordination   Interventions:  Evaluation of current treatment plan related to pregnancy and patient's adherence to plan as established by provider. Advised patient to continue to work on smoking cessation, discussed cutting back one cigarette a week until she reaches her goal Provided education to patient re: hypertension Reviewed medications with patient and discussed the importance of picking up prescriptions and taking as directed. Discussed Keppra and how important it is to take regularly, encouraged patient to set an alarm on her phone to increase compliance Discussed plans  with patient for ongoing care management follow up and provided patient with direct contact information for care management team Reviewed scheduled/upcoming provider appointments including: 8/22 Placement of Loop recorder, 8/24 Virtual Visit Provided number for medical transportation provided by [facility] [phone] Encouraged patient to call [facility] for member benefits [phone] Self Care Activities:  Patient will self administer medications as prescribed Patient will attend all scheduled provider appointments Patient will call pharmacy for medication refills Patient will call provider office for new concerns or questions Patient Goals: - contact the Medicaid Enrollment Broker [phone] for questions re: enrolling for Medicaid after delivery - work with MM Pharmacist, Harrold Donath for medication management - begin a notebook of services in my neighborhood or community - follow-up on any referrals for help I am given - make a list of family or friends that I can call - call [employer] [phone] for available benefits for pregnancy and possible assistance with home repairs  - call [phone] 2-3 days before your next appointment to arrange for medical transportation provided by [facility] - arrange a ride through an agency 1 week before  appointment - ask family or friend for a ride - call to cancel if needed - keep a calendar with prescription refill dates - keep a calendar with appointment dates Follow Up Plan: Telephone follow up appointment with care management team member scheduled for:09/29/20 @ 1pm      Follow Up:  Patient agrees to Care Plan and Follow-up.  Plan: The Managed Medicaid care management team will reach out to the patient again over the next 30 days.  Date/time of next scheduled RN care management/care coordination outreach:  09/29/20 @ 1pm  Estanislado Emms RN, BSN [place]  [facility]

## 2020-09-02 ENCOUNTER — Telehealth: Payer: Self-pay | Admitting: Internal Medicine

## 2020-09-02 NOTE — Telephone Encounter (Signed)
..   Medicaid Managed Care   Unsuccessful Outreach Note  09/02/2020 Name: Jane Tran MRN: [id number] DOB: Feb 08, 1985  Referred by: Havery Moros, MD Reason for referral : High Risk Managed Medicaid and Appointment (I called  the patient today to offer her a phone visit with the MM Pharmacist. I left my name and number on her VM.)   An unsuccessful telephone outreach was attempted today. The patient was referred to the case management team for assistance with care management and care coordination.   Follow Up Plan: The care management team will reach out to the patient again over the next 7-14 days.   Weston Settle Care Guide, High Risk Medicaid Managed Care Embedded Care Coordination [facility]  [facility]

## 2020-09-03 ENCOUNTER — Encounter: Payer: Self-pay | Admitting: Family Medicine

## 2020-09-03 DIAGNOSIS — Z7189 Other specified counseling: Secondary | ICD-10-CM | POA: Insufficient documentation

## 2020-09-07 ENCOUNTER — Telehealth: Payer: Self-pay | Admitting: Internal Medicine

## 2020-09-07 ENCOUNTER — Ambulatory Visit (INDEPENDENT_AMBULATORY_CARE_PROVIDER_SITE_OTHER): Payer: Medicaid Other | Admitting: Clinical

## 2020-09-07 DIAGNOSIS — F419 Anxiety disorder, unspecified: Secondary | ICD-10-CM

## 2020-09-07 DIAGNOSIS — F331 Major depressive disorder, recurrent, moderate: Secondary | ICD-10-CM

## 2020-09-07 DIAGNOSIS — Z658 Other specified problems related to psychosocial circumstances: Secondary | ICD-10-CM

## 2020-09-07 NOTE — Telephone Encounter (Signed)
..   Medicaid Managed Care   Unsuccessful Outreach Note  09/07/2020 Name: Jane Tran MRN: [id number] DOB: 05-09-1985  Referred by: Havery Moros, MD Reason for referral : High Risk Managed Medicaid and Appointment (Attempted to reach the patient today to get her scheduled for a phone visit with the MM Pharmacist. I left my name and number on her VM.)   A second unsuccessful telephone outreach was attempted today. The patient was referred to the case management team for assistance with care management and care coordination.   Follow Up Plan: The care management team will reach out to the patient again over the next 7 days.   Weston Settle Care Guide, High Risk Medicaid Managed Care Embedded Care Coordination [facility]  [facility]

## 2020-09-09 DIAGNOSIS — R9089 Other abnormal findings on diagnostic imaging of central nervous system: Secondary | ICD-10-CM | POA: Diagnosis not present

## 2020-09-09 DIAGNOSIS — R569 Unspecified convulsions: Secondary | ICD-10-CM | POA: Diagnosis not present

## 2020-09-10 ENCOUNTER — Other Ambulatory Visit: Payer: Medicaid Other

## 2020-09-10 ENCOUNTER — Telehealth: Payer: Self-pay | Admitting: Internal Medicine

## 2020-09-10 ENCOUNTER — Other Ambulatory Visit: Payer: Self-pay | Admitting: General Practice

## 2020-09-10 DIAGNOSIS — O0990 Supervision of high risk pregnancy, unspecified, unspecified trimester: Secondary | ICD-10-CM

## 2020-09-10 NOTE — Telephone Encounter (Signed)
..   Medicaid Managed Care   Unsuccessful Outreach Note  09/10/2020 Name: Jane Tran MRN: [id number] DOB: [birth year]  Referred by: Havery Moros, MD Reason for referral : High Risk Managed Medicaid and Appointment (I called the patient today to get her scheduled for a phone visit with the [facility] Pharmacist. I left my name and number on her VM.)   A second unsuccessful telephone outreach was attempted today. The patient was referred to the case management team for assistance with care management and care coordination.   Follow Up Plan: The care management team will reach out to the patient again over the next 7 days.   Weston Settle Care Guide, High Risk Medicaid Managed Care Embedded Care Coordination [facility]  [facility]

## 2020-09-14 ENCOUNTER — Telehealth: Payer: Self-pay | Admitting: Internal Medicine

## 2020-09-14 NOTE — Telephone Encounter (Signed)
..   Medicaid Managed Care   Unsuccessful Outreach Note  09/14/2020 Name: Jane Tran MRN: [id number] DOB: 1985/12/22  Referred by: Havery Moros, MD Reason for referral : High Risk Managed Medicaid (I called the patient today to get her scheduled for a phone visit with the MM Pharmacist. I left my name and number on her VM.)   Third unsuccessful telephone outreach was attempted today. The patient was referred to the case management team for assistance with care management and care coordination. The patient's primary care provider has been notified of our unsuccessful attempts to make or maintain contact with the patient. The care management team is pleased to engage with this patient at any time in the future should he/she be interested in assistance from the care management team.   Follow Up Plan: We have been unable to make contact with the patient for follow up. The care management team is available to follow up with the patient after provider conversation with the patient regarding recommendation for care management engagement and subsequent re-referral to the care management team.   Weston Settle Care Guide, High Risk Medicaid Managed Care Embedded Care Coordination [facility]  [facility]

## 2020-09-29 ENCOUNTER — Other Ambulatory Visit: Payer: Self-pay | Admitting: *Deleted

## 2020-09-29 NOTE — Patient Instructions (Signed)
Visit Information  Ms. [name]  - as a part of your Medicaid benefit, you are eligible for care management and care coordination services at no cost or copay. I was unable to reach you by phone today but would be happy to help you with your health related needs. Please feel free to call me @ [phone].   A member of the Managed Medicaid care management team will reach out to you again over the next 14 days.   Estanislado Emms RN, BSN [place]  [facility]

## 2020-09-29 NOTE — Patient Outreach (Signed)
Care Coordination  09/29/2020  [name] [birth year] [id number]   Medicaid Managed Care   Unsuccessful Outreach Note  09/29/2020 Name: Jane Tran MRN: [id number] DOB: 1985-05-25  Referred by: Havery Moros, MD Reason for referral : High Risk Managed Medicaid (Unsuccessful RNCM follow up outreach)   An unsuccessful telephone outreach was attempted today. The patient was referred to the case management team for assistance with care management and care coordination.   Follow Up Plan: A HIPAA compliant phone message was left for the patient providing contact information and requesting a return call.   Estanislado Emms RN, BSN [place]  [facility]

## 2020-09-30 DIAGNOSIS — R002 Palpitations: Secondary | ICD-10-CM | POA: Diagnosis not present

## 2020-09-30 DIAGNOSIS — R55 Syncope and collapse: Secondary | ICD-10-CM | POA: Diagnosis not present

## 2020-10-01 ENCOUNTER — Ambulatory Visit: Payer: Medicaid Other | Attending: Maternal & Fetal Medicine

## 2020-10-01 ENCOUNTER — Encounter: Payer: Self-pay | Admitting: *Deleted

## 2020-10-01 ENCOUNTER — Other Ambulatory Visit: Payer: Self-pay

## 2020-10-01 ENCOUNTER — Ambulatory Visit: Payer: Medicaid Other | Admitting: *Deleted

## 2020-10-01 ENCOUNTER — Other Ambulatory Visit: Payer: Self-pay | Admitting: *Deleted

## 2020-10-01 VITALS — BP 130/69 | HR 91

## 2020-10-01 DIAGNOSIS — O99333 Smoking (tobacco) complicating pregnancy, third trimester: Secondary | ICD-10-CM | POA: Diagnosis not present

## 2020-10-01 DIAGNOSIS — O09529 Supervision of elderly multigravida, unspecified trimester: Secondary | ICD-10-CM | POA: Insufficient documentation

## 2020-10-01 DIAGNOSIS — O09523 Supervision of elderly multigravida, third trimester: Secondary | ICD-10-CM

## 2020-10-01 DIAGNOSIS — G40909 Epilepsy, unspecified, not intractable, without status epilepticus: Secondary | ICD-10-CM

## 2020-10-01 DIAGNOSIS — O09513 Supervision of elderly primigravida, third trimester: Secondary | ICD-10-CM

## 2020-10-01 DIAGNOSIS — Z3A32 32 weeks gestation of pregnancy: Secondary | ICD-10-CM | POA: Diagnosis not present

## 2020-10-01 DIAGNOSIS — O99353 Diseases of the nervous system complicating pregnancy, third trimester: Secondary | ICD-10-CM

## 2020-10-07 DIAGNOSIS — R55 Syncope and collapse: Secondary | ICD-10-CM | POA: Diagnosis not present

## 2020-10-13 DIAGNOSIS — R0789 Other chest pain: Secondary | ICD-10-CM | POA: Diagnosis not present

## 2020-10-13 DIAGNOSIS — R002 Palpitations: Secondary | ICD-10-CM | POA: Diagnosis not present

## 2020-10-13 DIAGNOSIS — R55 Syncope and collapse: Secondary | ICD-10-CM | POA: Diagnosis not present

## 2020-10-14 ENCOUNTER — Other Ambulatory Visit: Payer: Self-pay

## 2020-10-14 ENCOUNTER — Other Ambulatory Visit: Payer: Self-pay | Admitting: *Deleted

## 2020-10-14 DIAGNOSIS — Z9189 Other specified personal risk factors, not elsewhere classified: Secondary | ICD-10-CM

## 2020-10-14 NOTE — Patient Instructions (Signed)
Visit Information  Jane Tran was given information about Medicaid Managed Care team care coordination services as a part of their [facility] Medicaid benefit. Jane Tran verbally consented to engagement with the [facility] Managed Care team.   If you are experiencing a medical emergency, please call 911 or report to your local emergency department or urgent care.   If you have a non-emergency medical problem during routine business hours, please contact your provider's office and ask to speak with a nurse.   For questions related to your [facility], please call: [phone] or visit the homepage here: [email or web address]  If you would like to schedule transportation through your [facility]), please call the following number at least 2 days in advance of your appointment: [phone].   Call the Behavioral Health Crisis Line at [phone], at any time, 24 hours a day, 7 days a week. If you are in danger or need immediate medical attention call 911.  If you would like help to quit smoking, call 1-800-QUIT-NOW ((435)252-9362) OR Espaol: 1-855-Djelo-Ya (9-371-696-7893) o para ms informacin haga clic aqu or Text READY to 810-175 to register via text  Jane Tran - following are the goals we discussed in your visit today:   Goals Addressed             This Visit's Progress    Find Help in My Community       Timeframe:  Long-Range Goal Priority:  High Start Date:    06/25/20                         Expected End Date:   12/25/20                    Follow Up Date 11/04/20     - take your medications as directed - contact the Medicaid Enrollment Broker [phone] for questions re: enrolling for Medicaid after delivery - work with MM Pharmacist, Boykin Reaper for medication management - work with [name], Museum/gallery exhibitions officer for establishing therapy/counseling - begin a  notebook of services in my neighborhood or community - follow-up on any referrals for help I am given - make a list of family or friends that I can call - call [employer] [phone] for available benefits for pregnancy and possible assistance with home repairs  - call [phone] 2-3 days before your next appointment to arrange for medical transportation provided by [facility]   Why is this important?   Knowing how and where to find help for yourself or family in your neighborhood and community is an important skill.  You will want to take some steps to learn how.         Make and Keep All Appointments       Timeframe:  Long-Range Goal Priority:  High Start Date:    06/25/20                         Expected End Date:  12/25/20                   Follow Up Date 11/04/20    - call The Center for [facility] for pregnancy care appointments [phone] - attend all scheduled appointments - arrange a ride through an agency 1 week before appointment - ask family or friend for a ride - call to cancel if needed - keep a calendar with prescription refill  dates - keep a calendar with appointment dates    Why is this important?   Part of staying healthy is seeing the doctor for follow-up care.  If you forget your appointments, there are some things you can do to stay on track.            Please see education materials related to pregnancy provided by MyChart link.  Patient has access to MyChart and can view provided education  Telephone follow up appointment with Managed Medicaid care management team member scheduled for:11/04/20 @ 10:30am  Estanislado Emms RN, BSN [place]  [facility] RN Care Coordinator   Following is a copy of your plan of care:  Patient Care Plan: General Plan of Care (Adult)     Problem Identified: Health Promotion or Disease Self-Management (General Plan of Care)      Long-Range Goal: Self-Management Plan Developed    Start Date: 06/25/2020  Expected End Date: 12/24/2020  Recent Progress: On track  Priority: High  Note:   Current Barriers:  Ineffective Self Health Maintenance-Jane Tran is being evaluated for seizures vs syncopal episodes while being pregnant. 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 She is concerned about transportation to upcoming appointments, financial needs while making repairs to her home, obtaining medications and understanding her health while pregnant. Jane Tran is concerned about her insurance coverage after delivery and continuing her medical care with her new diagnosis. -Update-Patient is needing an appointment for continuation of pregnancy care. She missed last scheduled appointment. She is having increased anxiety and concerns as she nears the last weeks of pregnancy. She has been out of work since having seizures and needs financial assistance affording her medications. Does not adhere to prescribed medication regimen-Patient has not been able to afford prescribed medications. Her significant other gave her the money today for medications, she plans to get them and start taking today. Currently UNABLE TO independently self manage needs related to chronic health conditions.  Knowledge Deficits related to short term plan for care coordination needs and long term plans for chronic disease management needs Nurse Case Manager Clinical Goal(s):  patient will work with care management team to address care coordination and chronic disease management needs related to Care Coordination   Interventions:  Evaluation of current treatment plan related to pregnancy and patient's adherence to plan as established by provider. Provided education to patient re: pregnancy Collaborated with staff at [facility] for needed appointment for continuation of pregnancy care Reviewed medications with patient and discussed patient having difficulty affording medications and having  transportation picking up refills Referral to MM Pharmacist, patient requested another call to be scheduled Referral to Care Guide for lacking social connections Referral to Perrin Maltese for assistance establishing therapy/counseling Discussed plans with patient for ongoing care management follow up and provided patient with direct contact information for care management team Reviewed scheduled/upcoming provider appointments including: MFM 10/29/20 and scheduling visit with Center for [facility] [phone] Provided number for medical transportation provided by [facility] [phone] Provided therapeutic listening Self Care Activities:  Patient will self administer medications as prescribed Patient will attend all scheduled provider appointments Patient will call pharmacy for medication refills Patient will call provider office for new concerns or questions Patient Goals: - call Center for [facility] for pregnancy care appointments [phone] - take your medications as directed - contact the Medicaid Enrollment Broker [phone] for questions re: enrolling for Medicaid after delivery - work with MM Pharmacist, Boykin Reaper  for medication management - work with Alexander Mt, Museum/gallery exhibitions officer for establishing therapy/counseling - begin a notebook of services in my neighborhood or community - follow-up on any referrals for help I am given - make a list of family or friends that I can call - call [employer] [phone] for available benefits for pregnancy and possible assistance with home repairs  - call [phone] 2-3 days before your next appointment to arrange for medical transportation provided by [facility] - arrange a ride through an agency 1 week before appointment - ask family or friend for a ride - call to cancel if needed - keep a calendar with prescription refill dates - keep a calendar with appointment dates Follow Up Plan: Telephone follow up appointment with care management  team member scheduled for:11/04/20 @ 10:30 am

## 2020-10-14 NOTE — Patient Outreach (Signed)
Medicaid Managed Care   Nurse Care Manager Note  10/14/2020 Name:  Jane Tran MRN:  [id number] DOB:  [birth year]  Jane Tran is an 35 y.o. year old female who is a primary patient of [name], [name], MD.  The Medicaid Managed Care Coordination team was consulted for assistance with:    Obstetrics healthcare management needs  Jane Tran was given information about Medicaid Managed Care Coordination team services today. Jane Tran Patient agreed to services and verbal consent obtained.  Engaged with patient by telephone for follow up visit in response to provider referral for case management and/or care coordination services.   Assessments/Interventions:  Review of past medical history, allergies, medications, health status, including review of consultants reports, laboratory and other test data, was performed as part of comprehensive evaluation and provision of chronic care management services.  SDOH (Social Determinants of Health) assessments and interventions performed: SDOH Interventions    Flowsheet Row Most Recent Value  SDOH Interventions   Stress Interventions Other (Comment)  [Referral to [name], [name] for assistance with establishing therapy]  Social Connections Interventions Other (Comment)  [name], museum/gallery Guide referral for Social Connections]       Care Plan  Allergies  Allergen Reactions   Albuterol Other (See Comments) and Shortness Of Breath    States it cause her to hyperventlate States it cause her to hyperventlate    Methocarbamol Other (See Comments)    States it causes her to hyperventilate   Penicillins    Tramadol     Medications Reviewed Today     Reviewed by [name], RN (Registered Nurse) on 10/14/20 at 1109  Med List Status: <None>   Medication Order Taking? Sig Documenting Provider Last Dose Status Informant  acetaminophen (TYLENOL) 500 MG tablet [id number]  Take 2 tablets (1,000 mg total) by mouth every 8 (eight) hours as needed for  moderate pain.  Patient not taking: Reported on 07/14/2020   [name], [name]  Active   aspirin EC 81 MG tablet [id number] Yes Take 1 tablet (81 mg total) by mouth daily. Take after 12 weeks for prevention of preeclampsia later in pregnancy [name], MD Taking Active            Med Note [name], Doreather Hoxworth A   Wed Sep 01, 2020  3:44 PM)    Blood Pressure Monitoring DEVI [id number] No 1 each by Does not apply route once a week.  Patient not taking: No sig reported   [name], MD Not Taking Active   folic acid (FOLVITE) 1 MG tablet [id number] Yes Take 1 mg by mouth daily. [provider] Taking Active            Med Note ([name] A   Thu Oct 14, 2020 11:05 AM)    levETIRAcetam (KEPPRA) 500 MG tablet [id number] Yes Take 500 mg by mouth 2 (two) times daily. [provider] Taking Active            Med Note ([name] A   Thu Oct 14, 2020 11:06 AM)    Prenatal Vit-Fe Fumarate-FA (PRENATAL MULTIVITAMIN) TABS tablet [id number] Yes Take 1 tablet by mouth daily at 12 noon. [provider] Taking Active   sertraline (ZOLOFT) 50 MG tablet [id number] Yes TAKE 1 TABLET (50 MG TOTAL) BY MOUTH DAILY. TAKE 1/2 TABLET FOR 5-7 DAYS [name], MD Taking Active             Patient Active Problem List   Diagnosis  Date Noted   Red Chart Rounds Patient 09/03/2020   Carrier of spinal muscular atrophy 06/04/2020   Trichomonosis 05/20/2020   Abnormal brain MRI 05/20/2020   Syncopal episodes 05/20/2020   Supervision of high risk pregnancy, antepartum 05/11/2020   Anxiety 05/11/2020   AMA (advanced maternal age) multigravida 35+ 05/11/2020   Seizures (HCC) 04/22/2020   Anxiety and depression 12/21/2017   Tobacco abuse 04/12/2012   Marijuana abuse 04/12/2012    Conditions to be addressed/monitored per PCP order:   Obstetrics health management needs  Care Plan : General Plan of Care (Adult)  Updates made by [name], RN since 10/14/2020 12:00 AM      Problem: Health Promotion or Disease Self-Management (General Plan of Care)      Long-Range Goal: Self-Management Plan Developed   Start Date: 06/25/2020  Expected End Date: 12/24/2020  Recent Progress: On track  Priority: High  Note:   Current Barriers:  Ineffective Self Health Maintenance-Jane Tran is being evaluated for seizures vs syncopal episodes while being pregnant. 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 She is concerned about transportation to upcoming appointments, financial needs while making repairs to her home, obtaining medications and understanding her health while pregnant. Jane Tran is concerned about her insurance coverage after delivery and continuing her medical care with her new diagnosis. -Update-Patient is needing an appointment for continuation of pregnancy care. She missed last scheduled appointment. She is having increased anxiety and concerns as she nears the last weeks of pregnancy. She has been out of work since having seizures and needs financial assistance affording her medications. Does not adhere to prescribed medication regimen-Patient has not been able to afford prescribed medications. Her significant other gave her the money today for medications, she plans to get them and start taking today. Currently UNABLE TO independently self manage needs related to chronic health conditions.  Knowledge Deficits related to short term plan for care coordination needs and long term plans for chronic disease management needs Nurse Case Manager Clinical Goal(s):  patient will work with care management team to address care coordination and chronic disease management needs related to Care Coordination   Interventions:  Evaluation of current treatment plan related to pregnancy and patient's adherence to plan as established by provider. Provided education to patient re: pregnancy Collaborated with staff at [facility] for needed appointment for  continuation of pregnancy care Reviewed medications with patient and discussed patient having difficulty affording medications and having transportation picking up refills Referral to MM Pharmacist, patient requested another call to be scheduled Referral to Care Guide for lacking social connections Referral to Perrin Maltese for assistance establishing therapy/counseling Discussed plans with patient for ongoing care management follow up and provided patient with direct contact information for care management team Reviewed scheduled/upcoming provider appointments including: MFM 10/29/20 and scheduling visit with Center for [facility] [phone] Provided number for medical transportation provided by [facility] [phone] Provided therapeutic listening Self Care Activities:  Patient will self administer medications as prescribed Patient will attend all scheduled provider appointments Patient will call pharmacy for medication refills Patient will call provider office for new concerns or questions Patient Goals: - call Center for [facility] for pregnancy care appointments [phone] - take your medications as directed - contact the Medicaid Enrollment Broker [phone] for questions re: enrolling for Medicaid after delivery - work with MM Pharmacist, Boykin Reaper for medication management - work with Alexander Mt, Museum/gallery exhibitions officer for establishing therapy/counseling - begin a notebook of services  in my neighborhood or community - follow-up on any referrals for help I am given - make a list of family or friends that I can call - call [employer] [phone] for available benefits for pregnancy and possible assistance with home repairs  - call [phone] 2-3 days before your next appointment to arrange for medical transportation provided by [facility] - arrange a ride through an agency 1 week before appointment - ask family or friend for a ride - call to cancel if needed - keep a calendar  with prescription refill dates - keep a calendar with appointment dates Follow Up Plan: Telephone follow up appointment with care management team member scheduled for:11/04/20 @ 10:30 am      Follow Up:  Patient agrees to Care Plan and Follow-up.  Plan: The Managed Medicaid care management team will reach out to the patient again over the next 21 days.  Date/time of next scheduled RN care management/care coordination outreach:  11/04/20 @ 10:30am  Estanislado Emms RN, BSN [place]  [facility] Care Coordinator

## 2020-10-18 DIAGNOSIS — R55 Syncope and collapse: Secondary | ICD-10-CM | POA: Diagnosis not present

## 2020-10-21 ENCOUNTER — Other Ambulatory Visit: Payer: Self-pay

## 2020-10-21 ENCOUNTER — Ambulatory Visit (INDEPENDENT_AMBULATORY_CARE_PROVIDER_SITE_OTHER): Payer: Medicaid Other | Admitting: Student

## 2020-10-21 VITALS — BP 134/81 | HR 83 | Wt 163.5 lb

## 2020-10-21 DIAGNOSIS — F32A Depression, unspecified: Secondary | ICD-10-CM

## 2020-10-21 DIAGNOSIS — F419 Anxiety disorder, unspecified: Secondary | ICD-10-CM

## 2020-10-21 DIAGNOSIS — Z3A35 35 weeks gestation of pregnancy: Secondary | ICD-10-CM

## 2020-10-21 MED ORDER — SERTRALINE HCL 50 MG PO TABS: 50.0000 mg | ORAL_TABLET | Freq: Every day | ORAL | 1 refills | Status: DC

## 2020-10-21 MED ORDER — LEVETIRACETAM 500 MG PO TABS: 500.0000 mg | ORAL_TABLET | Freq: Two times a day (BID) | ORAL | 1 refills | Status: AC

## 2020-10-21 MED ORDER — FOLIC ACID 1 MG PO TABS: 1.0000 mg | ORAL_TABLET | Freq: Every day | ORAL | 0 refills | Status: DC

## 2020-10-21 NOTE — Progress Notes (Addendum)
   PRENATAL VISIT NOTE  Subjective:  Jane Tran is a 35 y.o. G1P0 at [redacted]w[redacted]d being seen today for ongoing prenatal care.  She is currently monitored for the following issues for this high-risk pregnancy and has Supervision of high risk pregnancy, antepartum; Anxiety; AMA (advanced maternal age) multigravida 35+; Trichomonosis; Abnormal brain MRI; Syncopal episodes; Carrier of spinal muscular atrophy; Anxiety and depression; Seizures (HCC); Tobacco abuse; Marijuana abuse; and Red Chart Rounds Patient on their problem list.  Patient reports no complaints.  Contractions: Not present. Vag. Bleeding: None.  Movement: Present. Denies leaking of fluid.   The following portions of the patient's history were reviewed and updated as appropriate: allergies, current medications, past family history, past medical history, past social history, past surgical history and problem list.   Objective:   Vitals:   10/21/20 0829  BP: 134/81  Pulse: 83  Weight: 163 lb 8 oz (74.2 kg)    Fetal Status: Fetal Heart Rate (bpm): 145 Fundal Height: 35 cm Movement: Present     General:  Alert, oriented and cooperative. Patient is in no acute distress.  Skin: Skin is warm and dry. No rash noted.   Cardiovascular: Normal heart rate noted  Respiratory: Normal respiratory effort, no problems with respiration noted  Abdomen: Soft, gravid, appropriate for gestational age.  Pain/Pressure: Present     Pelvic: Cervical exam deferred        Extremities: Normal range of motion.  Edema: Trace  Mental Status: Normal mood and affect. Normal behavior. Normal judgment and thought content.   Assessment and Plan:  Pregnancy: G1P0 at [redacted]w[redacted]d  1. [redacted] weeks gestation of pregnancy   2. Anxiety and depression   -patient states that she does not plan to get MRI with contrast postpartum; she has appt with Dr. Logan Bores at [place] at the end of the month -cardiology appt to be scheduled after baby is born; she has a heart monitor  implant -refills given on Zoloft, Keppra, Folic acid; # for [facility] transportation given  -keep [place] appt on 10-14; FH is appropriate today -will reschedule 2 hour GTT  -needs to see MD to talk about delivery plan -scared to death of an IUD; will sign BTL papers today -she has elevated depression score but denies SI; refills given  Preterm labor symptoms and general obstetric precautions including but not limited to vaginal bleeding, contractions, leaking of fluid and fetal movement were reviewed in detail with the patient. Please refer to After Visit Summary for other counseling recommendations.   Return in about 1 week (around 10/28/2020), or needs to see MD next week, double book if necessary she is high risk.  Future Appointments  Date Time Provider Department Center  10/25/2020  9:30 AM [facility] LAB [facility]  10/29/2020  1:00 PM [facility] NURSE [facility]  10/29/2020  1:15 PM [facility] US2 [facility]  11/04/2020 10:30 AM [facility] CCC-MM CARE MANAGER [facility] None    Marylene Land, CNM

## 2020-10-21 NOTE — Patient Instructions (Signed)
[facility] [employer] [phone].

## 2020-10-21 NOTE — Progress Notes (Signed)
No keppra and folic acid Rx x 1 week due to transportation. Would like Zoloft Rx refill today. Elevated PHQ-9 today, but denies SI.

## 2020-10-25 ENCOUNTER — Other Ambulatory Visit: Payer: Medicaid Other

## 2020-10-27 ENCOUNTER — Telehealth: Payer: Self-pay

## 2020-10-27 NOTE — Telephone Encounter (Signed)
   Telephone encounter was:  Unsuccessful.  10/27/2020 Name: Jane Tran MRN: [id number] DOB: [birth year]  Unsuccessful outbound call made today to assist with:   social isolation.  Outreach Attempt:  1st Attempt  A HIPAA compliant voice message was left requesting a return call.  Instructed patient to call back at [phone].  Rasheda Ledger, AAS Paralegal, [facility] Care Guide  Embedded Care Coordination [place]  Care Management  [address] [place], [place] [address] [email or web address]  ?? [phone]   [email or web address]

## 2020-10-29 ENCOUNTER — Ambulatory Visit: Payer: Medicaid Other | Attending: Obstetrics

## 2020-10-29 ENCOUNTER — Other Ambulatory Visit: Payer: Self-pay | Admitting: Obstetrics

## 2020-10-29 ENCOUNTER — Encounter: Payer: Self-pay | Admitting: *Deleted

## 2020-10-29 ENCOUNTER — Other Ambulatory Visit: Payer: Self-pay

## 2020-10-29 ENCOUNTER — Ambulatory Visit: Payer: Medicaid Other | Admitting: *Deleted

## 2020-10-29 VITALS — BP 120/84 | HR 84

## 2020-10-29 DIAGNOSIS — Z3A36 36 weeks gestation of pregnancy: Secondary | ICD-10-CM | POA: Diagnosis not present

## 2020-10-29 DIAGNOSIS — O0990 Supervision of high risk pregnancy, unspecified, unspecified trimester: Secondary | ICD-10-CM | POA: Diagnosis present

## 2020-10-29 DIAGNOSIS — O368130 Decreased fetal movements, third trimester, not applicable or unspecified: Secondary | ICD-10-CM | POA: Diagnosis not present

## 2020-10-29 DIAGNOSIS — O99353 Diseases of the nervous system complicating pregnancy, third trimester: Secondary | ICD-10-CM

## 2020-10-29 DIAGNOSIS — O09523 Supervision of elderly multigravida, third trimester: Secondary | ICD-10-CM | POA: Diagnosis not present

## 2020-10-29 DIAGNOSIS — O09513 Supervision of elderly primigravida, third trimester: Secondary | ICD-10-CM | POA: Insufficient documentation

## 2020-10-29 DIAGNOSIS — G40909 Epilepsy, unspecified, not intractable, without status epilepticus: Secondary | ICD-10-CM

## 2020-10-29 DIAGNOSIS — A599 Trichomoniasis, unspecified: Secondary | ICD-10-CM | POA: Insufficient documentation

## 2020-11-01 ENCOUNTER — Other Ambulatory Visit: Payer: Self-pay | Admitting: *Deleted

## 2020-11-01 DIAGNOSIS — O09523 Supervision of elderly multigravida, third trimester: Secondary | ICD-10-CM

## 2020-11-03 ENCOUNTER — Other Ambulatory Visit (HOSPITAL_COMMUNITY)
Admission: RE | Admit: 2020-11-03 | Discharge: 2020-11-03 | Disposition: A | Discharge status: Home / Self Care | Payer: Medicaid Other | Source: Ambulatory Visit | Attending: Family Medicine | Admitting: Family Medicine

## 2020-11-03 ENCOUNTER — Other Ambulatory Visit: Payer: Medicaid Other

## 2020-11-03 ENCOUNTER — Other Ambulatory Visit: Payer: Self-pay

## 2020-11-03 ENCOUNTER — Ambulatory Visit (INDEPENDENT_AMBULATORY_CARE_PROVIDER_SITE_OTHER): Payer: Medicaid Other | Admitting: Family Medicine

## 2020-11-03 VITALS — BP 129/76 | HR 78 | Wt 168.7 lb

## 2020-11-03 DIAGNOSIS — O09522 Supervision of elderly multigravida, second trimester: Secondary | ICD-10-CM

## 2020-11-03 DIAGNOSIS — O0990 Supervision of high risk pregnancy, unspecified, unspecified trimester: Secondary | ICD-10-CM | POA: Diagnosis not present

## 2020-11-03 DIAGNOSIS — R9089 Other abnormal findings on diagnostic imaging of central nervous system: Secondary | ICD-10-CM

## 2020-11-03 DIAGNOSIS — R569 Unspecified convulsions: Secondary | ICD-10-CM

## 2020-11-03 DIAGNOSIS — Z7189 Other specified counseling: Secondary | ICD-10-CM

## 2020-11-03 DIAGNOSIS — Z23 Encounter for immunization: Secondary | ICD-10-CM | POA: Diagnosis not present

## 2020-11-03 DIAGNOSIS — A599 Trichomoniasis, unspecified: Secondary | ICD-10-CM

## 2020-11-03 NOTE — Patient Instructions (Signed)

## 2020-11-03 NOTE — Progress Notes (Signed)
   PRENATAL VISIT NOTE  Subjective:  Jane Tran is a 35 y.o. G1P0 at [redacted]w[redacted]d being seen today for ongoing prenatal care.  She is currently monitored for the following issues for this low-risk pregnancy and has Supervision of high risk pregnancy, antepartum; Anxiety; AMA (advanced maternal age) multigravida 35+; Abnormal brain MRI; Syncopal episodes; Carrier of spinal muscular atrophy; Anxiety and depression; Seizures (HCC); Tobacco abuse; Marijuana abuse; and Red Chart Rounds Patient on their problem list.  Patient reports no complaints.  Contractions: Not present. Vag. Bleeding: None.  Movement: Present. Denies leaking of fluid.   The following portions of the patient's history were reviewed and updated as appropriate: allergies, current medications, past family history, past medical history, past social history, past surgical history and problem list.   Objective:   Vitals:   11/03/20 1012  BP: 129/76  Pulse: 78  Weight: 168 lb 11.2 oz (76.5 kg)    Fetal Status: Fetal Heart Rate (bpm): 142 Fundal Height: 32 cm Movement: Present  Presentation: Vertex  General:  Alert, oriented and cooperative. Patient is in no acute distress.  Skin: Skin is warm and dry. No rash noted.   Cardiovascular: Normal heart rate noted  Respiratory: Normal respiratory effort, no problems with respiration noted  Abdomen: Soft, gravid, appropriate for gestational age.  Pain/Pressure: Present     Pelvic: Cervical exam performed in the presence of a chaperone Dilation: Closed Effacement (%): 80 Station: -1  Extremities: Normal range of motion.  Edema: Trace  Mental Status: Normal mood and affect. Normal behavior. Normal judgment and thought content.   Assessment and Plan:  Pregnancy: G1P0 at [redacted]w[redacted]d 1. Supervision of high risk pregnancy, antepartum Catch up on labs - Strep Gp B Culture+Rflx - Cervicovaginal ancillary only( [place]) - Glucose, 1 hour gestational - RPR - HIV Antibody (routine testing w  rflx) - CBC - Antibody screen - ABO/Rh  3. Multigravida of advanced maternal age in second trimester LR NIPT  4. Abnormal brain MRI Post delivery f/u  5. Seizures (HCC) On Keppra  Preterm labor symptoms and general obstetric precautions including but not limited to vaginal bleeding, contractions, leaking of fluid and fetal movement were reviewed in detail with the patient. Please refer to After Visit Summary for other counseling recommendations.   Return in 1 week (on 11/10/2020) for [facility], needs MD.  Future Appointments  Date Time Provider Department Center  11/04/2020 10:30 AM [facility] CCC-MM CARE MANAGER [facility] None  11/05/2020  9:30 AM [facility] NURSE [facility]  11/05/2020  9:45 AM [facility] NST [facility]  11/11/2020  8:55 AM Federico Flake, MD [facility]  11/12/2020  1:15 PM [facility] NST [facility]  11/19/2020  2:00 PM [facility] US1 [facility]    [name], MD

## 2020-11-04 ENCOUNTER — Telehealth: Payer: Self-pay

## 2020-11-04 ENCOUNTER — Other Ambulatory Visit: Payer: Self-pay | Admitting: *Deleted

## 2020-11-04 LAB — CBC
Hematocrit: 33 % — ABNORMAL LOW (ref 34.0–46.6)
Hemoglobin: 11.5 g/dL (ref 11.1–15.9)
MCH: 32.1 pg (ref 26.6–33.0)
MCHC: 34.8 g/dL (ref 31.5–35.7)
MCV: 92 fL (ref 79–97)
Platelets: 330 10*3/uL (ref 150–450)
RBC: 3.58 x10E6/uL — ABNORMAL LOW (ref 3.77–5.28)
RDW: 11.4 % — ABNORMAL LOW (ref 11.7–15.4)
WBC: 13.9 10*3/uL — ABNORMAL HIGH (ref 3.4–10.8)

## 2020-11-04 LAB — RPR: RPR Ser Ql: NONREACTIVE

## 2020-11-04 LAB — GLUCOSE, 1 HOUR GESTATIONAL: Gestational Diabetes Screen: 110 mg/dL (ref 70–139)

## 2020-11-04 LAB — HIV ANTIBODY (ROUTINE TESTING W REFLEX): HIV Screen 4th Generation wRfx: NONREACTIVE

## 2020-11-04 LAB — CERVICOVAGINAL ANCILLARY ONLY
Chlamydia: NEGATIVE
Comment: NEGATIVE
Comment: NORMAL
Neisseria Gonorrhea: NEGATIVE

## 2020-11-04 LAB — ABO/RH: Rh Factor: POSITIVE

## 2020-11-04 LAB — ANTIBODY SCREEN: Antibody Screen: NEGATIVE

## 2020-11-04 NOTE — Patient Instructions (Signed)
Visit Information  Ms. Pattye Lanni  - as a part of your Medicaid benefit, you are eligible for care management and care coordination services at no cost or copay. I was unable to reach you by phone today but would be happy to help you with your health related needs. Please feel free to call me @ [phone]   A member of the Managed Medicaid care management team will reach out to you again over the next 7 days.   Jayonna Meyering RN, BSN [place]  [facility] RN Care Coordinator   

## 2020-11-04 NOTE — Telephone Encounter (Signed)
   Telephone encounter was:  Unsuccessful.  11/04/2020 Name: Jane Tran MRN: [id number] DOB: [birth year]  Unsuccessful outbound call made today to assist with:   social isolation.   Outreach Attempt:  2nd Attempt  A HIPAA compliant voice message was left requesting a return call.  Instructed patient to call back at [phone].  Aymar Whitfill, AAS Paralegal, [facility] Care Guide  Embedded Care Coordination [place]  Care Management  [address] [place], [place] [address] ??[email or web address]  ?? [phone]   [email or web address]

## 2020-11-04 NOTE — Patient Outreach (Signed)
Care Coordination  11/04/2020  Jane Tran September 23, 1985 [id number]   Medicaid Managed Care   Unsuccessful Outreach Note  11/04/2020 Name: Jane Tran MRN: [id number] DOB: 16-Aug-1985  Referred by: Havery Moros, MD Reason for referral : High Risk Managed Medicaid (Unsuccessful RNCM follow up telephone outreach)   An unsuccessful telephone outreach was attempted today. The patient was referred to the case management team for assistance with care management and care coordination.   Follow Up Plan: A HIPAA compliant phone message was left for the patient providing contact information and requesting a return call.   Estanislado Emms RN, BSN [place]  [facility]

## 2020-11-05 ENCOUNTER — Ambulatory Visit: Payer: Medicaid Other | Admitting: *Deleted

## 2020-11-05 ENCOUNTER — Other Ambulatory Visit: Payer: Self-pay

## 2020-11-05 ENCOUNTER — Ambulatory Visit: Payer: Medicaid Other | Attending: Obstetrics and Gynecology | Admitting: *Deleted

## 2020-11-05 VITALS — BP 115/72 | HR 80

## 2020-11-05 DIAGNOSIS — Z3A37 37 weeks gestation of pregnancy: Secondary | ICD-10-CM | POA: Insufficient documentation

## 2020-11-05 DIAGNOSIS — O09523 Supervision of elderly multigravida, third trimester: Secondary | ICD-10-CM | POA: Insufficient documentation

## 2020-11-05 DIAGNOSIS — G40909 Epilepsy, unspecified, not intractable, without status epilepticus: Secondary | ICD-10-CM | POA: Insufficient documentation

## 2020-11-05 DIAGNOSIS — O99353 Diseases of the nervous system complicating pregnancy, third trimester: Secondary | ICD-10-CM | POA: Insufficient documentation

## 2020-11-05 DIAGNOSIS — O0990 Supervision of high risk pregnancy, unspecified, unspecified trimester: Secondary | ICD-10-CM

## 2020-11-05 NOTE — Procedures (Signed)
Jane Tran [birth year] [redacted]w[redacted]d  Fetus A Non-Stress Test Interpretation for 11/05/20  Indication:  AMA, Seizure Disorder  Fetal Heart Rate A Mode: External Baseline Rate (A): 140 bpm Variability: Moderate Accelerations: 15 x 15 Decelerations: None Multiple birth?: No  Uterine Activity Mode: Palpation, Toco Contraction Frequency (min): ui Resting Tone Palpated: Relaxed  Interpretation (Fetal Testing) Nonstress Test Interpretation: Reactive Overall Impression: Reassuring for gestational age Comments: Dr. Judeth Cornfield reviewed tracing.

## 2020-11-08 ENCOUNTER — Telehealth: Payer: Self-pay

## 2020-11-08 LAB — STREP GP B SUSCEPTIBILITY

## 2020-11-08 LAB — STREP GP B CULTURE+RFLX: Strep Gp B Culture+Rflx: POSITIVE — AB

## 2020-11-08 NOTE — Telephone Encounter (Signed)
   Telephone encounter was:  Unsuccessful.  11/08/2020 Name: Jane Tran MRN: [id number] DOB: October 28, 1985  Unsuccessful outbound call Left message for patient to return my call regarding community resources needed.  made today to assist with:   social isolation.   Outreach Attempt:  3rd Attempt.  Referral closed unable to contact patient.  A HIPAA compliant voice message was left requesting a return call.  Instructed patient to call back at [phone].  Laticia Vannostrand, AAS Paralegal, [facility] Care Guide  Embedded Care Coordination [place]  Care Management  [address] [place], [place] [address] ??[email or web address]  ?? [phone]   [email or web address]

## 2020-11-09 ENCOUNTER — Encounter: Payer: Self-pay | Admitting: Family Medicine

## 2020-11-09 DIAGNOSIS — O99820 Streptococcus B carrier state complicating pregnancy: Secondary | ICD-10-CM | POA: Insufficient documentation

## 2020-11-10 ENCOUNTER — Other Ambulatory Visit: Payer: Self-pay | Admitting: *Deleted

## 2020-11-10 ENCOUNTER — Other Ambulatory Visit: Payer: Self-pay

## 2020-11-10 NOTE — BH Specialist Note (Signed)
error 

## 2020-11-10 NOTE — Patient Outreach (Signed)
Medicaid Managed Care   Nurse Care Manager Note  11/10/2020 Name:  Jane Tran MRN:  [id number] DOB:  [birth year]  Jane Tran is an 35 y.o. year old female who is Tran primary patient of Jane Tran, Jane Sarna, MD.  The Medicaid Managed Care Coordination team was consulted for assistance with:    Obstetrics healthcare management needs  Jane Tran was given information about Medicaid Managed Care Coordination team services today. Jane Tran Patient agreed to services and verbal consent obtained.  Engaged with patient by telephone for follow up visit in response to provider referral for case management and/or care coordination services.   Assessments/Interventions:  Review of past medical history, allergies, medications, health status, including review of consultants reports, laboratory and other test data, was performed as part of comprehensive evaluation and provision of chronic care management services.  SDOH (Social Determinants of Health) assessments and interventions performed: SDOH Interventions    Flowsheet Row Most Recent Value  SDOH Interventions   Housing Interventions Intervention Not Indicated  Social Connections Interventions Other (Comment)  [collaborate with Care Guide to reopen previous referral]       Care Plan  Allergies  Allergen Reactions   Albuterol Other (See Comments) and Shortness Of Breath    States it cause her to hyperventlate States it cause her to hyperventlate    Methocarbamol Other (See Comments)    States it causes her to hyperventilate   Penicillins    Tramadol     Medications Reviewed Today     Reviewed by Jane Tran (Registered Nurse) on 11/10/20 at [phone]  Med List Status: <None>   Medication Order Taking? Sig Documenting Provider Last Dose Status Informant  aspirin EC 81 MG tablet [id number] Yes Take 1 tablet (81 mg total) by mouth daily. Take after 12 weeks for prevention of preeclampsia later in pregnancy Jane Staggers,  MD Taking Active            Med Note Jane Tran   Wed Sep 01, 2020  3:44 PM)    folic acid (FOLVITE) 1 MG tablet [id number] Yes Take 1 tablet (1 mg total) by mouth daily. Jane Tran Taking Active   levETIRAcetam (KEPPRA) 500 MG tablet [id number] Yes Take 1 tablet (500 mg total) by mouth 2 (two) times daily. Jane Tran Taking Active   Prenatal Vit-Fe Fumarate-FA (PRENATAL MULTIVITAMIN) TABS tablet [id number] Yes Take 1 tablet by mouth daily at 12 noon. [provider] Taking Active   sertraline (ZOLOFT) 50 MG tablet [id number] Yes Take 1 tablet (50 mg total) by mouth daily. Take one tablet daily Jane Tran Taking Active             Patient Active Problem List   Diagnosis Date Noted   Group B Streptococcus carrier, +RV culture, currently pregnant 11/09/2020   Red Chart Rounds Patient 09/03/2020   Carrier of spinal muscular atrophy 06/04/2020   Abnormal brain MRI 05/20/2020   Syncopal episodes 05/20/2020   Supervision of high risk pregnancy, antepartum 05/11/2020   Anxiety 05/11/2020   AMA (advanced maternal age) multigravida 35+ 05/11/2020   Seizures (HCC) 04/22/2020   Anxiety and depression 12/21/2017   Tobacco abuse 04/12/2012   Marijuana abuse 04/12/2012    Conditions to be addressed/monitored per PCP order:   Obstetric healthcare management needs  Care Plan : General Plan of Care (Adult)  Updates made by Jane Tran since 11/10/2020 12:00 AM     Problem: Health Promotion  or Disease Self-Management (General Plan of Care)      Long-Range Goal: Self-Management Plan Developed   Start Date: 06/25/2020  Expected End Date: 12/24/2020  Recent Progress: On track  Priority: High  Note:   Current Barriers:  Ineffective Self Health Maintenance-Jane Tran is being evaluated for seizures vs syncopal episodes while being pregnant. 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 She is  concerned about transportation to upcoming appointments, financial needs while making repairs to her home, obtaining medications and understanding her health while pregnant. Jane Tran is concerned about her insurance coverage after delivery and continuing her medical care with her new diagnosis. Patient is needing an appointment for continuation of pregnancy care. She missed last scheduled appointment. She is having increased anxiety and concerns as she nears the last weeks of pregnancy. She has been out of work since having seizures and needs financial assistance affording her medications.-Update-Jane Tran has OB appointments scheduled. She is having increased anxiety about upcoming delivery. She is having issues with her phone and needs assistance scheduling transportation to upcoming appointments. She has Tran car seat for after delivery. Does not adhere to prescribed medication regimen-Patient has not been able to afford prescribed medications. Her significant other gave her the money today for medications, she plans to get them and start taking today. Currently UNABLE TO independently self manage needs related to chronic health conditions.  Knowledge Deficits related to short term plan for care coordination needs and long term plans for chronic disease management needs Nurse Case Manager Clinical Goal(s):  patient will work with care management team to address care coordination and chronic disease management needs related to Care Coordination   Interventions:  Evaluation of current treatment plan related to pregnancy and patient's adherence to plan as established by provider. Provided education to patient re: pregnancy Reviewed medications with patient and explained to patient [facility] will provide transportation for medication procurement Referral to Care Guide for lacking social connections-requested to reopen referral Collaborated with Jane Tran at [facility] for managing anxiety Discussed plans with  patient for ongoing care management follow up and provided patient with direct contact information for care management team Reviewed scheduled/upcoming provider appointments including: [facility] on 10/27 @ 8:55(RNCM arranged transportation thru [employer]), 10/28 @ MFM(transportation arranged thru [id number]) and 11/4 @ MFM(transportation arranged thru [id number]) Provided number for medical transportation provided by [facility] [phone], RNCM arranged transportation to appointment on 10/28 and 11/4-Patient aware of pick up times Provided therapeutic listening Performed SDOH assesment Self Care Activities:  Patient will self administer medications as prescribed Patient will attend all scheduled provider appointments Patient will call pharmacy for medication refills Patient will call provider office for new concerns or questions Patient Goals: - call Center for [facility] for pregnancy care appointments [phone] - take your medications as directed - contact the Medicaid Enrollment Broker [phone] for questions re: enrolling for Medicaid after delivery - work with MM Pharmacist, Boykin Reaper for medication management - work with [name], Museum/gallery exhibitions officer for establishing therapy/counseling - begin Tran notebook of services in my neighborhood or community - follow-up on any referrals for help I am given - make Tran list of family or friends that I can call - call [employer] [phone] for available benefits for pregnancy and possible assistance with home repairs  - call [phone] 2-3 days before your next appointment to arrange for medical transportation provided by [facility] - arrange Tran ride through an agency 1 week before appointment - ask family or  friend for Tran ride - call to cancel if needed - keep Tran calendar with prescription refill dates - keep Tran calendar with appointment dates Follow Up Plan: Telephone follow up appointment with care management team member scheduled for:11/17/20 @  1:15pm      Follow Up:  Patient agrees to Care Plan and Follow-up.  Plan: The Managed Medicaid care management team will reach out to the patient again over the next 7 days.  Date/time of next scheduled Tran care management/care coordination outreach:  11/17/20 @ 1:15pm  Estanislado Emms Tran, BSN [place]  [facility]

## 2020-11-10 NOTE — Patient Instructions (Signed)
Visit Information  Ms. Jane Tran was given information about Medicaid Managed Care team care coordination services as a part of their [facility] Medicaid benefit. Jane Tran verbally consented to engagement with the [facility] Managed Care team.   If you are experiencing a medical emergency, please call 911 or report to your local emergency department or urgent care.   If you have a non-emergency medical problem during routine business hours, please contact your provider's office and ask to speak with a nurse.   For questions related to your [facility], please call: [phone] or visit the homepage here: [email or web address]  If you would like to schedule transportation through your [facility], please call the following number at least 2 days in advance of your appointment: [phone].   Call the Behavioral Health Crisis Line at [phone], at any time, 24 hours a day, 7 days a week. If you are in danger or need immediate medical attention call 911.  If you would like help to quit smoking, call 1-800-QUIT-NOW (4024079818) OR Espaol: 1-855-Djelo-Ya (6-378-588-5027) o para ms informacin haga clic aqu or Text READY to 741-287 to register via text  Ms. Jane Tran - following are the goals we discussed in your visit today:   Goals Addressed             This Visit's Progress    Find Help in My Community       Timeframe:  Long-Range Goal Priority:  High Start Date:    06/25/20                         Expected End Date:   11/16/20                    Follow Up Date 11/16/20     - take your medications as directed - contact the Medicaid Enrollment Broker [phone] for questions re: enrolling for Medicaid after delivery - work with MM Pharmacist, Jane Tran for medication management - work with Jane Tran, Museum/gallery exhibitions officer for establishing therapy/counseling - begin a  notebook of services in my neighborhood or community - follow-up on any referrals for help I am given - make a list of family or friends that I can call - call [employer] [phone] for available benefits for pregnancy and possible assistance with home repairs  - call [phone] 2-3 days before your next appointment to arrange for medical transportation provided by [facility]   Why is this important?   Knowing how and where to find help for yourself or family in your neighborhood and community is an important skill.  You will want to take some steps to learn how.         Make and Keep All Appointments       Timeframe:  Long-Range Goal Priority:  High Start Date:    06/25/20                         Expected End Date:  12/25/20                   Follow Up Date 11/17/20    - call [facility] for pregnancy care appointments [phone] - attend all scheduled appointments - arrange a ride through an agency 1 week before appointment - ask family or friend for a ride - call to cancel if needed - keep a calendar with prescription refill  dates - keep a calendar with appointment dates    Why is this important?   Part of staying healthy is seeing the doctor for follow-up care.  If you forget your appointments, there are some things you can do to stay on track.            Please see education materials related to pregnancy provided by MyChart link.  Patient has access to MyChart and can view provided education  Telephone follow up appointment with Managed Medicaid care management team member scheduled for:11/17/20 @ 1:15pm  Estanislado Emms RN, BSN [place]  [facility] RN Care Coordinator   Following is a copy of your plan of care:  Patient Care Plan: General Plan of Care (Adult)     Problem Identified: Health Promotion or Disease Self-Management (General Plan of Care)      Long-Range Goal: Self-Management Plan Developed   Start  Date: 06/25/2020  Expected End Date: 12/24/2020  Recent Progress: On track  Priority: High  Note:   Current Barriers:  Ineffective Self Health Maintenance-Jane Tran is being evaluated for seizures vs syncopal episodes while being pregnant. 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 She is concerned about transportation to upcoming appointments, financial needs while making repairs to her home, obtaining medications and understanding her health while pregnant. Jane Tran is concerned about her insurance coverage after delivery and continuing her medical care with her new diagnosis. Patient is needing an appointment for continuation of pregnancy care. She missed last scheduled appointment. She is having increased anxiety and concerns as she nears the last weeks of pregnancy. She has been out of work since having seizures and needs financial assistance affording her medications.-Update-Jane Tran has OB appointments scheduled. She is having increased anxiety about upcoming delivery. She is having issues with her phone and needs assistance scheduling transportation to upcoming appointments. She has a car seat for after delivery. Does not adhere to prescribed medication regimen-Patient has not been able to afford prescribed medications. Her significant other gave her the money today for medications, she plans to get them and start taking today. Currently UNABLE TO independently self manage needs related to chronic health conditions.  Knowledge Deficits related to short term plan for care coordination needs and long term plans for chronic disease management needs Nurse Case Manager Clinical Goal(s):  patient will work with care management team to address care coordination and chronic disease management needs related to Care Coordination   Interventions:  Evaluation of current treatment plan related to pregnancy and patient's adherence to plan as established by provider. Provided  education to patient re: pregnancy Reviewed medications with patient and explained to patient [facility] will provide transportation for medication procurement Referral to Care Guide for lacking social connections-requested to reopen referral Collaborated with [facility] at [facility] for managing anxiety Discussed plans with patient for ongoing care management follow up and provided patient with direct contact information for care management team Reviewed scheduled/upcoming provider appointments including: [facility] on 10/27 @ 8:55(RNCM arranged transportation thru [employer]), 10/28 @ MFM(transportation arranged thru [id number]) and 11/4 @ MFM(transportation arranged thru [id number]) Provided number for medical transportation provided by [facility] [phone], RNCM arranged transportation to appointment on 10/28 and 11/4-Patient aware of pick up times Provided therapeutic listening Performed SDOH assesment Self Care Activities:  Patient will self administer medications as prescribed Patient will attend all scheduled provider appointments Patient will call pharmacy for medication refills Patient will call provider office for new  concerns or questions Patient Goals: - call Center for [facility] for pregnancy care appointments [phone] - take your medications as directed - contact the Medicaid Enrollment Broker [phone] for questions re: enrolling for Medicaid after delivery - work with MM Pharmacist, Jane Tran for medication management - work with Jane Tran, Museum/gallery exhibitions officer for establishing therapy/counseling - begin a notebook of services in my neighborhood or community - follow-up on any referrals for help I am given - make a list of family or friends that I can call - call [employer] [phone] for available benefits for pregnancy and possible assistance with home repairs  - call [phone] 2-3 days before your next appointment to arrange for medical transportation provided by [facility] -  arrange a ride through an agency 1 week before appointment - ask family or friend for a ride - call to cancel if needed - keep a calendar with prescription refill dates - keep a calendar with appointment dates Follow Up Plan: Telephone follow up appointment with care management team member scheduled for:11/17/20 @ 1:15pm

## 2020-11-11 ENCOUNTER — Ambulatory Visit (INDEPENDENT_AMBULATORY_CARE_PROVIDER_SITE_OTHER): Payer: Medicaid Other | Admitting: Family Medicine

## 2020-11-11 ENCOUNTER — Ambulatory Visit (INDEPENDENT_AMBULATORY_CARE_PROVIDER_SITE_OTHER): Payer: Medicaid Other | Admitting: Clinical

## 2020-11-11 ENCOUNTER — Other Ambulatory Visit: Payer: Self-pay

## 2020-11-11 ENCOUNTER — Encounter: Payer: Self-pay | Admitting: Family Medicine

## 2020-11-11 ENCOUNTER — Ambulatory Visit: Payer: Medicaid Other | Admitting: Clinical

## 2020-11-11 VITALS — BP 116/82 | HR 75 | Wt 171.3 lb

## 2020-11-11 DIAGNOSIS — F331 Major depressive disorder, recurrent, moderate: Secondary | ICD-10-CM | POA: Diagnosis not present

## 2020-11-11 DIAGNOSIS — F32A Depression, unspecified: Secondary | ICD-10-CM

## 2020-11-11 DIAGNOSIS — Z658 Other specified problems related to psychosocial circumstances: Secondary | ICD-10-CM

## 2020-11-11 DIAGNOSIS — O09523 Supervision of elderly multigravida, third trimester: Secondary | ICD-10-CM

## 2020-11-11 DIAGNOSIS — F419 Anxiety disorder, unspecified: Secondary | ICD-10-CM

## 2020-11-11 DIAGNOSIS — O99820 Streptococcus B carrier state complicating pregnancy: Secondary | ICD-10-CM | POA: Diagnosis not present

## 2020-11-11 DIAGNOSIS — Z7189 Other specified counseling: Secondary | ICD-10-CM

## 2020-11-11 DIAGNOSIS — R569 Unspecified convulsions: Secondary | ICD-10-CM | POA: Diagnosis not present

## 2020-11-11 DIAGNOSIS — O0990 Supervision of high risk pregnancy, unspecified, unspecified trimester: Secondary | ICD-10-CM | POA: Diagnosis not present

## 2020-11-11 MED ORDER — HYDROXYZINE HCL 25 MG PO TABS: 25.0000 mg | ORAL_TABLET | Freq: Four times a day (QID) | ORAL | 4 refills | Status: AC | PRN

## 2020-11-11 MED ORDER — SERTRALINE HCL 100 MG PO TABS: 100.0000 mg | ORAL_TABLET | Freq: Every day | ORAL | 12 refills | Status: AC

## 2020-11-11 NOTE — Patient Instructions (Signed)
AREA PEDIATRIC/FAMILY PRACTICE PHYSICIANS  Central/[place]outheast [place] ([address]) [facility] Chambliss, MD; Eniola, MD; Hale, MD; Hensel, MD; McDiarmid, MD; McIntyer, MD; Neal, MD; Walden, MD [address] [place], [place] [address] [phone] Mon-Fri 8:30-12:30, 1:30-5:00 Providers come to see babies at [facility] Accepting Medicaid [facility] providers who accept newborns: Koirala, MD; Morrow, MD; Wolters, MD [address], [place], [place] [address] [phone] Mon-Fri 8:00-5:30 Babies seen by providers at [facility] Does NOT accept Medicaid Please call early in hospitalization for appointment (limited availability)  [facility] Mulberry, MD [address] [place], [place] [address] [phone] Mon, Tue, Thur, Fri 8:30-5:00, Wed 10:00-7:00 (closed 1-2pm) Babies seen by [facility] providers Accepting Medicaid Rubin - Pediatrician Rubin, MD [address], [place], [place] [address] [phone] Mon-Fri 8:30-5:00, Sat 8:30-12:00 Provider comes to see babies at [facility] Accepting Medicaid Must have been referred from current patients or contacted office prior to delivery [facility] ([facility]) Brown, MD; Chandler, MD; Ettefagh, MD; Grant, MD; Lester, MD; McCormick, MD; McQueen, MD; Prose, MD; Simha, MD; Stanley, MD; Stryffeler, NP; Tebben, NP [address], [place], [place] [address] [phone] Mon, Tue, Thur, Fri 8:30-5:30, Wed 9:30-5:30, Sat 8:30-12:30 Babies seen by [facility] providers Accepting Medicaid Only accepting infants of first-time parents or siblings of current patients Hospital discharge coordinator will make follow-up appointment Jack Amos [address], [place], [place]  [address] [phone]   Fax - [phone] [facility] [address], [place], [place]  [address] Phone - [phone]   Fax - [phone] Shilpa Gosrani [address], [place], [place]  [address] [phone]  East/[place]ortheast [place] ([address]) [facility] Bates, MD; Brassfield, MD; Cooper, Cox, MD; MD; Davis, MD; Dovico, MD; Ettefaugh, MD; Little, MD; Lowe, MD; Keiffer, MD; Melvin, MD; Sumner, MD; Williams, MD [address], [place], [place] [address] [phone] Mon-Fri 8:30-5:00 (extended evenings Mon-Thur as needed), Sat-Sun 10:00-1:00 Providers come to see babies at [facility] Accepting Medicaid for families of first-time babies and families with all children in the household age 3 and under. Must register with office prior to making appointment (M-F only). [facility] Henson, NP; Knapp, MD; Lalonde, MD; Tysinger, PA [address] [place], [place] [address] [phone] Mon-Fri 8:00-5:00 Babies seen by providers at [facility] Does NOT accept Medicaid/Commercial Insurance Only [facility] ([facility])  Artis, MD; Barnes, MD; Bratton, MD; Coccaro, MD; [place] Gardner, MD; Kramer, MD; Marshall, MD; Netherton, MD; Poleto, MD; Skinner, MD [address] [place], [place] [address] [phone] Mon-Fri 8:30-5:30, Sat (Oct.-Mar.) 9:00-1:00 Babies seen by providers at [facility] Accepting Medicaid  [place] ([address]) [facility] Reid, MD; Warner, MD [address], [place], [place] [address] [phone] Mon-Fri 8:30-5:00, Sat 8:30-12:00 Providers come to see babies at [facility] Does NOT accept Medicaid [facility] Becker, PA; Hagler, MD; Scifres, PA; Sun, MD; Swayne, MD [address], [place], [place] [address] [phone] Mon-Fri 8:00-5:00 Babies seen by providers at [facility] Does NOT accept Medicaid Only accepting babies of parents who  are patients Please call early in hospitalization for appointment (limited availability) [facility] Clark, MD; Frye, MD; Kelleher, MD; Mack, NP; Miller, MD; O'Keller, MD; Patterson, NP; Pudlo, MD; Puzio, MD; Thomas, MD; Tucker, MD; Twiselton, MD [address], [place], [place] [address] [phone] Mon-Fri 8:00-5:00, Sat 9:00-12:00 Providers come to see babies at [facility] Does NOT accept Medicaid  [place] ([address]) [facility] providers accepting new patients: Brake, NP; Wharton, PA [address], [place], [place] [address] [phone] Mon-Fri 8:00-5:00 Babies seen by providers at [facility] Does NOT accept Medicaid Only accepting babies of parents who are patients Please call early in hospitalization for appointment (limited availability) [facility] Gay, MD; Quinlan, MD [address] [place], [place] [address] [phone] (press 1 to schedule appointment) Mon-Fri 8:00-5:00 Providers come to see babies at [facility] Does NOT accept Medicaid [facility] Mazer, MD [address] [place], [place] [address] [phone] Mon-Fri 8:30-5:00 (lunch 12:30-1:00), extended hours by appointment only Wed 5:00-6:30 Babies seen by [facility] providers Accepting Medicaid [facility] Banks, MD; Jordan, MD; Koberlein, MD [address], [place], [place] [address] [phone] Mon-Fri 8:00-5:00 Babies seen by [facility] providers Does NOT accept Medicaid [facility] Parker, MD; Hunter, MD; Wallace, DO [address] [place], [place] [address] [phone] Mon-Fri 8:00-5:00 Babies seen by [facility] providers Does NOT accept Medicaid [facility] Brandon, PA; Brecken, PA; Christy, NP; Dees, MD; DeClaire, MD; DeWeese, MD; Hansen, NP; Mills, NP; Parrish, NP; Smoot, NP; Summer, MD; Vapne,  MD [address] [place], [place] [address] [phone] Mon-Fri 8:30-5:00, Sat 10:00-1:00 Providers come to see babies at [facility] Does NOT accept Medicaid Free prenatal information session Tuesdays at 4:45pm [facility] Bouska, MD; Gordon, PA; Jeffery, PA; Weber, PA [address] [place] [address] [phone] Mon-Fri 7:30-5:30 Babies seen by [facility] providers [facility] [address], [place], [place]  [address] [phone]   Fax - [phone]  [place] ([address] & [address]) [facility] [place] [address] [place], [place] [address] [phone] Mon-Thur 8:00-6:00 Providers come to see babies at [facility] Accepting Medicaid [facility] Anderson, NP; Badger, MD; Beal, PA; Spencer, PA [address] [place], [place] [address] [phone] Mon-Thur 7:30-7:30, Fri 7:30-4:30 Babies seen by [facility] providers Accepting Medicaid [facility] Agbuya, MD; Klett, NP; Romgoolam, MD [address], [place], [place] [address] [phone] Mon-Fri 8:30-5:00, Sat 8:30-12:00 Providers come to see babies at [facility] Accepting Medicaid Must have "Meet & Greet" appointment at office prior to delivery [facility] ([facility]) McCord, MD; Wallace, MD; Wood, MD [address], [place], [place] [address] [phone] Mon-Wed 8:00-6:00, Thur-Fri 8:00-5:00, Sat 9:00-12:00 Providers come to see babies at [facility] Does NOT accept Medicaid Only accepting siblings of current patients [facility]  [address], [place], [place]  [address] [phone]   Fax - [phone] [facility] [address], [place], [place]  [address] [phone]   Fax -  [phone]  [place] ([address] & [address]) [facility] Cirigliano, DO; Matthews, DO [address] , [place] [address] [phone] Mon-Fri 7:00-5:00 Babies seen by [facility] providers Does NOT accept Medicaid [facility] Briscoe, MD; Howley, PA; Moreira, PA [address], [place], [place] [address] [phone] Mon-Fri 8:00-5:00 Babies seen by [facility] providers Accepting Medicaid [facility] Boyd, MD; Church, PA; Jones, NP; Osborn, PA [address], , [place] [address] [phone] Mon-Fri 8:00-5:00 Babies seen by providers at [facility] Accepting Medicaid  [place] ([address]) [facility] [place] Wendling, DO [address] [place], [place] [address] [phone] Mon-Fri 8:00-5:00 Babies seen by [facility] providers Does NOT accept Medicaid Limited availability, please call early in hospitalization to schedule follow-up [facility] Calderon, PA; Cummings, MD; Dillard, MD; Martin, PA; Olson, MD; VanDeven, PA [address], [place], [place] [address] [phone] Mon-Fri 8:30-5:00, Sat 9:00-12:00 Babies seen by providers at [facility] Accepting Medicaid Please register online then schedule online or call office [email or web address] Hunter, NP; Kumar, MD; Martin Rogers, PA [address], [place], [place] [address] [phone] Mon-Fri 8:00-5:00 Babies seen by providers at [facility] Accepting Medicaid [facility] ([facility]) [place], MD; Kristi Fleenor, NP; West, MD [address], [place], [place] [address] [phone] Mon-Fri 8:00-5:30, Sat&Sun by appointment (phones open at  8:30) Babies seen by [facility] providers Accepting Medicaid Must be a first-time baby or sibling of current patient [facility] [place]  [address], [place], [place]  [address] [phone]   Fax - [phone]  [place] ([address]) [facility] Brown, PA; Cowen, PA; Rice, MD; Helton, PA; Spry, MD [address] [place], [place] [address] [phone] Mon-Thur 8:00-7:00, Fri 8:00-5:00, Sat 8:00-12:00, Sun 9:00-12:00 Babies seen by [facility] providers Accepting Medicaid [facility] [place] Coe-Goins, MD; Marshall, MD; Pierre-Louis, MD [address], [place], [place] [address] [phone] Mon-Thur 8:00-5:00 Babies seen by providers at [facility] Accepting Medicaid [facility] [place]at[place], MD; Coe-Goins, MD; Hayes, MD; Lewis, MD; List, MD; Lott, MD; Marshall, MD; Moran, MD; O'Neal, MD; Pierre-Louis, MD; Pitonzo, MD; Scholer, MD; Spangle, MD [address] [place], [place] [address] [phone] Mon-Fri 8:00-5:30, Sat (Oct.-Mar.) 9:00-1:00 Babies seen by providers at [facility] Accepting Medicaid Must fill out new patient packet, available online at [email or web address] [facility] ([facility]) Friddle, NP; Harris, NP; Kelly, NP; Logan, MD; Melvin, PA; Poth, MD; Ramadoss, MD; Stanton, NP [address], [place], [place] [address] [phone] Mon-Thur 8:00-5:30, Fri 8:00-5:00 Babies seen by providers at [facility] Accepting Medicaid  [place] ([address]) [facility] Dixon, PA; Binger, MD; Pickard, MD; Tapia, PA [address], [place], [place] [address] [phone] Mon-Fri 8:00-5:00 Babies seen by providers at [facility] Accepting Medicaid   [place] ([address]) [facility] [place] Masneri, DO; Meyers, MD; Nelson, PA [address], [place], [place] [address] [phone] Mon-Fri 8:00-5:00 Babies seen by providers at [facility] Does NOT accept Medicaid Limited appointment availability, please call early in hospitalization   [facility] [place] Kunedd, DO; McGowen, MD [address], [place], [place] [address] [phone] Mon-Fri 8:00-5:00 Babies seen by [facility] providers Does NOT accept Medicaid [facility] [place] Cameron, MD; MacDonald, MD; Michaels, PA; Nayak, MD [address] [place] [address], [place], [place] [address] [phone] Mon-Fri 8:00-5:00 After hours clinic ([address] [place],  [address]) [phone] Mon-Fri 5:00-8:00, Sat 12:00-6:00, Sun 10:00-4:00 Babies seen by [facility] providers Accepting Medicaid [facility] [place] [address], [place], [place]  [address] [phone]   Fax - [phone]  [place] ([address]) [facility] [place] [facility] Andy, MD [address], [place], [place] [address] [phone] Mon-Fri 8:00-5:00 Babies seen by [facility] providers Does NOT accept Medicaid [facility] [place] ([facility] [place]) Eksir, MD [address], [place], [place] [address] [phone] Mon-Thur 8:00-7:00, Fri 8:00-5:00, Sat 8:00-12:00 Babies seen by providers at [facility] Accepting Medicaid - but does not have vaccinations in office (must be received elsewhere) Limited availability, please call early in hospitalization  [place] ([address]) [facility]  Charlene Flemming, MD [address], [place] [address] [phone]  Fax [phone]  [facility]  Ziya Coonrod, MD, Annamarie Streilein, PA, Carla Hampton, PA [address] [place], [place]  [address] [phone] [facility]  [address], [place], [place] [address] [phone] [address], [place], [place] [address] [phone] (West Office)  [facility] [address], [place], [place] [address] [phone] [facility] [address], [place], [place] [address] [phone] [facility] [address], [place], [place] [address] [phone] [facility] [address], [place], [place] [address] [phone] [facility] [address], [place], [place] [address] [phone] [facility] [address], [place], [place] [address] [phone] [facility]  [address], [place], [place] [address] [phone] Dr[facility] [address], [place], [place] [address] [phone] [facility] [address], [place], [place] [address] [phone] [facility] [address], , [place] [address] [phone]  

## 2020-11-11 NOTE — Progress Notes (Signed)
   PRENATAL VISIT NOTE  Subjective:  Jane Tran is a 35 y.o. G1P0 at 21w6dbeing seen today for ongoing prenatal care.  She is currently monitored for the following issues for this high-risk pregnancy and has Supervision of high risk pregnancy, antepartum; Anxiety; AMA (advanced maternal age) multigravida 355+ Abnormal brain MRI; Syncopal episodes; Carrier of spinal muscular atrophy; Anxiety and depression; Seizures (HBonduel; Tobacco abuse; Marijuana abuse; Red Chart Rounds Patient; and Group B Streptococcus carrier, +RV culture, currently pregnant on their problem list.  Patient reports  increased anxiety-not coping as well .  Contractions: Not present. Vag. Bleeding: None.  Movement: Present. Denies leaking of fluid.   The following portions of the patient's history were reviewed and updated as appropriate: allergies, current medications, past family history, past medical history, past social history, past surgical history and problem list.   Objective:   Vitals:   11/11/20 0859  BP: 116/82  Pulse: 75  Weight: 171 lb 4.8 oz (77.7 kg)    Fetal Status: Fetal Heart Rate (bpm): 155   Movement: Present     General:  Alert, oriented and cooperative. Patient is in no acute distress.  Skin: Skin is warm and dry. No rash noted.   Cardiovascular: Normal heart rate noted  Respiratory: Normal respiratory effort, no problems with respiration noted  Abdomen: Soft, gravid, appropriate for gestational age.  Pain/Pressure: Absent     Pelvic: Cervical exam deferred        Extremities: Normal range of motion.  Edema: Trace  Mental Status: Normal mood and affect. Normal behavior. Normal judgment and thought content.   Assessment and Plan:  Pregnancy: G1P0 at 378w6d1. Group B Streptococcus carrier, +RV culture, currently pregnant Needs vancomycin in labor, orders in signed held  2. Red Chart Rounds Patient  3. Multigravida of advanced maternal age in third trimester NIP low risk, AFP neg  4.  Supervision of high risk pregnancy, antepartum MFM following borderline IUGR 10th%-- recommended 39 wk delivery in last evaluation Discussed process for IOL with patient today and requested appt Reviewed length of IOL and typical LOS Patient desires IOL Monday 11/7-- plan to request that day for AM and reviewed she will be called  5. Seizures (HCFlemingNone since last visit Met with Neurology who says she can push/have vaginal delivery  6. Anxiety and depression Discussed worsening symptoms Recommended increased dose to help with symptoms-- patient accepted.  - sertraline (ZOLOFT) 100 MG tablet; Take 1 tablet (100 mg total) by mouth daily. Take one tablet daily  Dispense: 30 tablet; Refill: 12 - hydrOXYzine (ATARAX/VISTARIL) 25 MG tablet; Take 1 tablet (25 mg total) by mouth every 6 (six) hours as needed for anxiety.  Dispense: 30 tablet; Refill: 4  Term labor symptoms and general obstetric precautions including but not limited to vaginal bleeding, contractions, leaking of fluid and fetal movement were reviewed in detail with the patient. Please refer to After Visit Summary for other counseling recommendations.   Return in about 1 week (around 11/18/2020) [facility] Future Appointments  Date Time Provider [facility]10/28/2022  1:00 PM [facility]/28/2022  1:15 PM [facility] NST [facility]/01/2020  1:15 PM THN CCC-MM CARE MANAGER THN-CCC None  11/19/2020  1:45 PM [facility] NURSE [facility]/04/2020  2:00 PM [facility] US1 [facility]/07/2020  7:30 AM MC-LD SCHED ROOM MC-INDC None    KiCaren MacadamMD

## 2020-11-11 NOTE — BH Specialist Note (Signed)
Integrated Behavioral Health via Telemedicine Visit  11/11/2020 Jane Tran [id number]  Number of Integrated Behavioral Health visits: 6 Session Start time: 1:14 Session End time: 2:11 Total time:  75  Referring Provider: Nettie [place], MD Patient/Family location: Home [facility] Provider location: Center for [facility]  All persons participating in visit: [name] and [facility] Hakeen Shipes   Types of Service: Individual psychotherapy and Video visit  I connected with Jane Tran and/or Jane Tran  n/a  via  Telephone or Video Enabled Telemedicine Application  (Video is Caregility application) and verified that I am speaking with the correct person using two identifiers. Discussed confidentiality: Yes   I discussed the limitations of telemedicine and the availability of in person appointments.  Discussed there is a possibility of technology failure and discussed alternative modes of communication if that failure occurs.  I discussed that engaging in this telemedicine visit, they consent to the provision of behavioral healthcare and the services will be billed under their insurance.  Patient and/or legal guardian expressed understanding and consented to Telemedicine visit: Yes   Presenting Concerns: Patient and/or family reports the following symptoms/concerns: Increased anxiety, depression in over two months, partially attributed to incomplete household renovations.  Duration of problem: Increase current pregnancy; Severity of problem:  moderately severe  Patient and/or Family's Strengths/Protective Factors: Social connections, Concrete supports in place (healthy food, safe environments, etc.), Sense of purpose, and Physical Health (exercise, healthy diet, medication compliance, etc.)  Goals Addressed: Patient will:  Reduce symptoms of: anxiety, depression, and stress   Increase knowledge and/or ability of: healthy  habits   Demonstrate ability to: Increase healthy adjustment to current life circumstances  Progress towards Goals:   Interventions: Interventions utilized:  Solution-Focused Strategies Standardized Assessments completed: GAD-7 and PHQ 9  Patient and/or Family Response: Pt agrees with continued treatment plan  Assessment: Patient currently experiencing Major depressive disorder, recurrent, moderately severe, Anxiety disorder, Psychosocial stress.   Patient may benefit from continued psychoeducation and brief therapeutic interventions regarding coping with symptoms of anxiety, depression, stress .  Plan: Follow up with behavioral health clinician on : [facility] medication management call within one week; postpartum mood check about two weeks after birth; Call [name] at [phone], as needed. Behavioral recommendations:  -Begin taking Zoloft 100mg  and Vistaril, as prescribed; continue taking prenatal vitamin -Continue plan to prioritize healthy self-care daily, for at least the next three weeks (includes two weeks postpartum), as discussed; allow others to work on household renovations at this time -Review pediatrician list on After Visit Summary from this morning medical visit; find pediatrician for baby before induction Referral(s): Integrated (In Clinic)  I discussed the assessment and treatment plan with the patient and/or parent/guardian. They were provided an opportunity to ask questions and all were answered. They agreed with the plan and demonstrated an understanding of the instructions.   They were advised to call back or seek an in-person evaluation if the symptoms worsen or if the condition fails to improve as anticipated.  [employer], LCSW  Depression screen [facility] 2/9 11/11/2020 11/03/2020 10/21/2020 08/11/2020 07/14/2020  Decreased Interest 2 2 1 1 2   Down, Depressed, Hopeless 2 2 2 1 2   PHQ - 2 Score 4 4 3 2 4   Altered sleeping 1 3 3 2 1   Tired,  decreased energy 2 2 3 1 1   Change in appetite 3 0 2 2 2   Feeling bad or failure about yourself  2 2  1 2 1   Trouble concentrating 1 1 2 2 1   Moving slowly or fidgety/restless 2 1 2  0 2  Suicidal thoughts 0 0 0 0 0  PHQ-9 Score 15 13 16 11 12   Difficult doing work/chores Not difficult at all - Not difficult at all - Not difficult at all   GAD 7 : Generalized Anxiety Score 11/11/2020 11/03/2020 10/21/2020 08/11/2020  Nervous, Anxious, on Edge 2 2 2 1   Control/stop worrying 2 3 2 1   Worry too much - different things 3 3 2 1   Trouble relaxing 3 2 2 1   Restless 3 2 2 2   Easily annoyed or irritable 1 1 1 2   Afraid - awful might happen 2 2 2 2   Total GAD 7 Score 16 15 13 10   Anxiety Difficulty Not difficult at all - - -

## 2020-11-12 ENCOUNTER — Ambulatory Visit: Payer: Medicaid Other | Admitting: *Deleted

## 2020-11-12 ENCOUNTER — Encounter (HOSPITAL_COMMUNITY): Payer: Self-pay | Admitting: Obstetrics & Gynecology

## 2020-11-12 ENCOUNTER — Ambulatory Visit (HOSPITAL_BASED_OUTPATIENT_CLINIC_OR_DEPARTMENT_OTHER): Payer: Medicaid Other

## 2020-11-12 ENCOUNTER — Inpatient Hospital Stay (HOSPITAL_COMMUNITY)
Admission: AD | Admit: 2020-11-12 | Discharge: 2020-11-12 | Disposition: A | Discharge status: Home / Self Care | Payer: Medicaid Other | Attending: Obstetrics & Gynecology | Admitting: Obstetrics & Gynecology

## 2020-11-12 ENCOUNTER — Other Ambulatory Visit: Payer: Self-pay | Admitting: *Deleted

## 2020-11-12 ENCOUNTER — Ambulatory Visit (HOSPITAL_BASED_OUTPATIENT_CLINIC_OR_DEPARTMENT_OTHER): Payer: Medicaid Other | Admitting: Obstetrics and Gynecology

## 2020-11-12 ENCOUNTER — Ambulatory Visit: Payer: Medicaid Other | Attending: Obstetrics | Admitting: *Deleted

## 2020-11-12 ENCOUNTER — Other Ambulatory Visit: Payer: Self-pay

## 2020-11-12 ENCOUNTER — Encounter: Payer: Self-pay | Admitting: *Deleted

## 2020-11-12 ENCOUNTER — Inpatient Hospital Stay (HOSPITAL_BASED_OUTPATIENT_CLINIC_OR_DEPARTMENT_OTHER): Payer: Medicaid Other

## 2020-11-12 ENCOUNTER — Other Ambulatory Visit: Payer: Self-pay | Admitting: Obstetrics and Gynecology

## 2020-11-12 VITALS — BP 133/77 | HR 84

## 2020-11-12 DIAGNOSIS — O0990 Supervision of high risk pregnancy, unspecified, unspecified trimester: Secondary | ICD-10-CM

## 2020-11-12 DIAGNOSIS — G40909 Epilepsy, unspecified, not intractable, without status epilepticus: Secondary | ICD-10-CM | POA: Insufficient documentation

## 2020-11-12 DIAGNOSIS — O99353 Diseases of the nervous system complicating pregnancy, third trimester: Secondary | ICD-10-CM | POA: Insufficient documentation

## 2020-11-12 DIAGNOSIS — O09523 Supervision of elderly multigravida, third trimester: Secondary | ICD-10-CM | POA: Insufficient documentation

## 2020-11-12 DIAGNOSIS — Z3A38 38 weeks gestation of pregnancy: Secondary | ICD-10-CM

## 2020-11-12 DIAGNOSIS — F17210 Nicotine dependence, cigarettes, uncomplicated: Secondary | ICD-10-CM | POA: Diagnosis not present

## 2020-11-12 DIAGNOSIS — O288 Other abnormal findings on antenatal screening of mother: Secondary | ICD-10-CM

## 2020-11-12 DIAGNOSIS — O99820 Streptococcus B carrier state complicating pregnancy: Secondary | ICD-10-CM

## 2020-11-12 DIAGNOSIS — O365930 Maternal care for other known or suspected poor fetal growth, third trimester, not applicable or unspecified: Secondary | ICD-10-CM

## 2020-11-12 DIAGNOSIS — O368130 Decreased fetal movements, third trimester, not applicable or unspecified: Secondary | ICD-10-CM | POA: Diagnosis not present

## 2020-11-12 DIAGNOSIS — O368190 Decreased fetal movements, unspecified trimester, not applicable or unspecified: Secondary | ICD-10-CM

## 2020-11-12 DIAGNOSIS — O0933 Supervision of pregnancy with insufficient antenatal care, third trimester: Secondary | ICD-10-CM

## 2020-11-12 DIAGNOSIS — O99333 Smoking (tobacco) complicating pregnancy, third trimester: Secondary | ICD-10-CM | POA: Diagnosis not present

## 2020-11-12 DIAGNOSIS — Z3689 Encounter for other specified antenatal screening: Secondary | ICD-10-CM

## 2020-11-12 HISTORY — DX: Unspecified convulsions: R56.9

## 2020-11-12 HISTORY — DX: Anxiety disorder, unspecified: F41.9

## 2020-11-12 HISTORY — DX: Presence of other cardiac implants and grafts: Z95.818

## 2020-11-12 HISTORY — DX: Depression, unspecified: F32.A

## 2020-11-12 NOTE — MAU Note (Signed)
PT SAYS WAS AT OFFICE TODAY- DR Judeth Cornfield SENT HER HERE- [facility] IN OFFICE SHE FAILED NST AND U/S  HERE FOR MONITORING  IS AN INDUCTION ON 11-7 UC'S - SOME

## 2020-11-12 NOTE — Progress Notes (Signed)
Maternal-Fetal Medicine   Name: Jane Tran DOB: [birth year] MRN: [id number] Referring Provider: Nettie Elm, MD  I had the pleasure of seeing Ms. Husband [facility] at [facility]. She is G1 P0 at [redacted] weeks gestation with seizure disorders on Keppra and returned for antenatal testing.  On ultrasound performed 2 weeks ago, the estimated fetal weight was at the 10th percentile. Patient has been having decreased fetal movements over the past few days. We performed NST first that was nonreactive.  Ultrasound Amniotic fluid is normal.  Cephalic presentation.  Fetal movements meet the criteria of BPP.  However, fetal breathing movements did not meet the criteria of BPP.  Umbilical artery Doppler showed normal forward diastolic flow. BPP 6/10. I explained the significance of biophysical profile scoring.  Given that she has been having decreased fetal movements, I recommended delivery today.  At her prenatal visit yesterday, induction of labor was scheduled for 11/22/2020. I explained that decreased fetal movements and and a BPP score of 6/10 [name] higher risk for perinatal mortality including stillbirth. Patient agreed to go to the MAU for further evaluation.  She has not decided about delivery yet. Blood pressure today at her office is 133/77 mmHg.  Recommendations -Patient to go to the MAU for further evaluation. -I discussed with MAU team. -I recommend delivery now. -If patient is not willing for delivery and the NST is reactive, induction of labor may be scheduled in 3 to 4 days.  Thank you for consultation.  If you have any questions or concerns, please contact me the [facility].  Consultation including face-to-face (more than 50%) counseling 20 minutes.

## 2020-11-12 NOTE — MAU Provider Note (Signed)
[name] is a 35 y.o. G1P0  At [redacted]w[redacted]d here for NST and possible BPP and continued evaluation for possible admission for IOL. She is diagnosed borderline FGR (EFW 10%) and has Supervision of high risk pregnancy, antepartum; Anxiety; AMA (advanced maternal age) multigravida 35+; Abnormal brain MRI; Syncopal episodes; Carrier of spinal muscular atrophy; Anxiety and depression; Seizures (HCC); Tobacco abuse; Marijuana abuse; Red Chart Rounds Patient; and Group B Streptococcus carrier, +RV culture, currently pregnant on their problem list.    She denies vaginal bleeding, contractions, LOF. She reports some decreased fetal movements; only two movements this evening. She reports that in general she is "very active" and that she thinks that is why she doesn't feel baby use. She denies fever, chills, SOB, cough, chest pain.   She had an [place] and Doppler today at MFM; NST was Non-reactive with decelerations and patient had BPP 6/10 (-2 breathing and -2 NST). Given her borderline FGR, non-reassuring NST and 6/10 BPP, patient was recommended to go to MAU for evaluation. Patient stated that she is "scared of labor" and so she didn't want to come in right away. She presetned for evaluation in MAU several hours later.   Patient has a strong history of anxiety; she also has a seizure disorder (vs. Syncope) and has been cleared by neurologist to labor.    History     CSN: [id number]  Arrival date and time: 11/12/20 1846   Event Date/Time   First Provider Initiated Contact with Patient 11/12/20 2000      Chief Complaint  Patient presents with   Non-stress Test    OB History     Gravida  1   Para      Term      Preterm      AB      Living         SAB      IAB      Ectopic      Multiple      Live Births              Past Medical History:  Diagnosis Date   Anxiety    Depression    Hypertension    Implantable loop recorder present    Seizures (HCC)     Past  Surgical History:  Procedure Laterality Date   APPENDECTOMY     TENDON REPAIR  2014    Family History  Problem Relation Age of Onset   Diabetes Father    Cancer Maternal Grandmother     Social History   Tobacco Use   Smoking status: Every Day    Packs/day: 0.50    Types: Cigarettes   Smokeless tobacco: Never  Vaping Use   Vaping Use: Never used  Substance Use Topics   Alcohol use: Not Currently   Drug use: Not Currently    Types: Marijuana    Comment: TODAY- 11-12-2020    Allergies:  Allergies  Allergen Reactions   Albuterol Other (See Comments) and Shortness Of Breath    States it cause her to hyperventlate States it cause her to hyperventlate    Methocarbamol Other (See Comments)    States it causes her to hyperventilate   Penicillins    Tramadol     Medications Prior to Admission  Medication Sig Dispense Refill Last Dose   aspirin EC 81 MG tablet Take 1 tablet (81 mg total) by mouth daily. Take after 12 weeks for prevention of preeclampsia later in pregnancy 300  tablet 2 11/12/2020   folic acid (FOLVITE) 1 MG tablet Take 1 tablet (1 mg total) by mouth daily. 90 tablet 0 11/12/2020   hydrOXYzine (ATARAX/VISTARIL) 25 MG tablet Take 1 tablet (25 mg total) by mouth every 6 (six) hours as needed for anxiety. 30 tablet 4 11/12/2020   levETIRAcetam (KEPPRA) 500 MG tablet Take 1 tablet (500 mg total) by mouth 2 (two) times daily. 90 tablet 1 11/12/2020   Prenatal Vit-Fe Fumarate-FA (PRENATAL MULTIVITAMIN) TABS tablet Take 1 tablet by mouth daily at 12 noon.   11/12/2020   sertraline (ZOLOFT) 100 MG tablet Take 1 tablet (100 mg total) by mouth daily. Take one tablet daily 30 tablet 12 11/12/2020    Review of Systems  Constitutional: Negative.   HENT: Negative.    Respiratory: Negative.    Cardiovascular: Negative.   Gastrointestinal: Negative.   Genitourinary: Negative.   Neurological: Negative.   Physical Exam   Blood pressure 134/81, pulse 83, temperature 98.1  F (36.7 C), temperature source Oral, resp. rate 20, height 5\' 7"  (1.702 m), weight 78.2 kg, last menstrual period 02/25/2020.  Physical Exam Constitutional:      Appearance: Normal appearance.  Cardiovascular:     Rate and Rhythm: Normal rate.  Pulmonary:     Effort: Pulmonary effort is normal.  Abdominal:     General: Abdomen is flat.  Musculoskeletal:        General: Normal range of motion.  Skin:    General: Skin is warm.  Neurological:     General: No focal deficit present.     Mental Status: She is alert.    MAU Course  Procedures  MDM -repeat NST and BPP here are reassuring. BPP 8/8.   NST: 135 bpm, mod var, present acel, no decels, occasional contractions -Patient strongly desires discharge and is agreeable to IOL in 3-4 days. Long discussion with patient about placental insufficiency, reasons for growth restrictions (patient is concerned that her smoking may have caused growth restriction), outcomes of FGR such as stillbirth. Patient and partner listened and asked many questions about FGR and repeated that they would like to wait a few days before IOL. They are reassured by baby's BPP and NST tonight, however, I cautioned that her BPP/NST were not reassuring today and that baby's status could change en utero at any time. Patient reiterated that she would like to wait for IOL.   Discussed patient's NST/BPP medical history and MFM recommendation with Dr. 04/24/2020, who is comfortable with discharge given that patient has been thoroughly counseled on the risks of waiting for IOL.   Assessment and Plan   1. NST (non-stress test) reactive   2. Decreased fetal movements   3. Decreased fetal movements in third trimester, single or unspecified fetus   4. Encounter for maternal care for suspected poor fetal growth in singleton pregnancy in third trimester    -IOL moved to Sunday (less than 48 hours away) -reviewed in detail fetal movements, fetal kick counts -reivewed warning signs  and when to return to MAU -all questions answered , patient and partner were given IOL instructions for Sunday and will wait for Telephone call Wednesday Jane Tran 11/12/2020, 8:02 PM

## 2020-11-12 NOTE — Discharge Instructions (Signed)
New Induction of Labor Process for [facility]  In Fall 2020 [facility] changed it's process for scheduling inductions of labor to create more induction slots and to make sure patients get COVID-19 testing in advance. After you have been tested you need to quarantine so that you do not get infected after your test. You should not go anywhere after your test except necessary medical appointments.   You have been scheduled for induction of labor on 11/14/2020. Although you may have a specific time listed on your After Visit Summary or MyChart, we cannot predict when your room will be available. Please disregard this time. A Labor and Delivery staff member will call you on the day that you are scheduled when your room is available. You will need to arrive within one hour of being called. If you do not arrive within this time frame, the next person on the list will be called in and you will move down the list. You may eat a light meal before coming to the hospital. If you go into labor, think your water has broken, experience bright red bleeding or don't feel your baby moving as much as usual before your induction, please call your Ob/Gyn's office or come to Entrance C, Maternity Assessment Unit for evaluation.  Thank you,  [facility] [employer]

## 2020-11-12 NOTE — Procedures (Signed)
Jane Tran [birth year] [redacted]w[redacted]d  Fetus A Non-Stress Test Interpretation for 11/12/20  Indication:  AMA, borderline IUGR, tobacco use  Fetal Heart Rate A Mode: External Baseline Rate (A): 155 bpm Variability: Moderate Accelerations: 10 x 10 Decelerations: Variable Multiple birth?: No  Uterine Activity Mode: Toco, Palpation Contraction Frequency (min): one prolonged u/c with variable decel with UI Contraction Quality: Mild Resting Tone Palpated: Relaxed Resting Time: Adequate  Interpretation (Fetal Testing) Nonstress Test Interpretation: Non-reactive Comments: Dr. Judeth Cornfield reviewed tracing; pt moved to rm 7 for BPP

## 2020-11-14 ENCOUNTER — Inpatient Hospital Stay (HOSPITAL_COMMUNITY)
Admission: AD | Admit: 2020-11-14 | Discharge: 2020-11-17 | DRG: 806 | Disposition: A | Discharge status: Home / Self Care | Payer: Medicaid Other | Attending: Obstetrics and Gynecology | Admitting: Obstetrics and Gynecology

## 2020-11-14 ENCOUNTER — Encounter (HOSPITAL_COMMUNITY): Payer: Self-pay | Admitting: Family Medicine

## 2020-11-14 ENCOUNTER — Other Ambulatory Visit: Payer: Self-pay

## 2020-11-14 ENCOUNTER — Inpatient Hospital Stay (HOSPITAL_COMMUNITY): Payer: Medicaid Other

## 2020-11-14 DIAGNOSIS — Z95818 Presence of other cardiac implants and grafts: Secondary | ICD-10-CM | POA: Diagnosis not present

## 2020-11-14 DIAGNOSIS — Z3A38 38 weeks gestation of pregnancy: Secondary | ICD-10-CM

## 2020-11-14 DIAGNOSIS — Z7189 Other specified counseling: Secondary | ICD-10-CM

## 2020-11-14 DIAGNOSIS — F17210 Nicotine dependence, cigarettes, uncomplicated: Secondary | ICD-10-CM | POA: Diagnosis present

## 2020-11-14 DIAGNOSIS — O09529 Supervision of elderly multigravida, unspecified trimester: Secondary | ICD-10-CM

## 2020-11-14 DIAGNOSIS — O164 Unspecified maternal hypertension, complicating childbirth: Secondary | ICD-10-CM | POA: Diagnosis not present

## 2020-11-14 DIAGNOSIS — G40909 Epilepsy, unspecified, not intractable, without status epilepticus: Secondary | ICD-10-CM | POA: Diagnosis present

## 2020-11-14 DIAGNOSIS — O99820 Streptococcus B carrier state complicating pregnancy: Secondary | ICD-10-CM

## 2020-11-14 DIAGNOSIS — O99344 Other mental disorders complicating childbirth: Secondary | ICD-10-CM | POA: Diagnosis present

## 2020-11-14 DIAGNOSIS — Z20822 Contact with and (suspected) exposure to covid-19: Secondary | ICD-10-CM | POA: Diagnosis present

## 2020-11-14 DIAGNOSIS — O365930 Maternal care for other known or suspected poor fetal growth, third trimester, not applicable or unspecified: Principal | ICD-10-CM | POA: Diagnosis present

## 2020-11-14 DIAGNOSIS — O99824 Streptococcus B carrier state complicating childbirth: Secondary | ICD-10-CM | POA: Diagnosis present

## 2020-11-14 DIAGNOSIS — O0990 Supervision of high risk pregnancy, unspecified, unspecified trimester: Secondary | ICD-10-CM

## 2020-11-14 DIAGNOSIS — Z880 Allergy status to penicillin: Secondary | ICD-10-CM

## 2020-11-14 DIAGNOSIS — O99334 Smoking (tobacco) complicating childbirth: Secondary | ICD-10-CM | POA: Diagnosis present

## 2020-11-14 DIAGNOSIS — O99354 Diseases of the nervous system complicating childbirth: Secondary | ICD-10-CM | POA: Diagnosis present

## 2020-11-14 DIAGNOSIS — Z148 Genetic carrier of other disease: Secondary | ICD-10-CM

## 2020-11-14 DIAGNOSIS — R569 Unspecified convulsions: Secondary | ICD-10-CM

## 2020-11-14 DIAGNOSIS — R9089 Other abnormal findings on diagnostic imaging of central nervous system: Secondary | ICD-10-CM | POA: Diagnosis present

## 2020-11-14 DIAGNOSIS — F419 Anxiety disorder, unspecified: Secondary | ICD-10-CM | POA: Diagnosis present

## 2020-11-14 DIAGNOSIS — R55 Syncope and collapse: Secondary | ICD-10-CM | POA: Diagnosis present

## 2020-11-14 DIAGNOSIS — O365990 Maternal care for other known or suspected poor fetal growth, unspecified trimester, not applicable or unspecified: Secondary | ICD-10-CM | POA: Diagnosis present

## 2020-11-14 LAB — TYPE AND SCREEN
ABO/RH(D): O POS
Antibody Screen: NEGATIVE

## 2020-11-14 LAB — CBC
HCT: 33.4 % — ABNORMAL LOW (ref 36.0–46.0)
Hemoglobin: 11.4 g/dL — ABNORMAL LOW (ref 12.0–15.0)
MCH: 32.9 pg (ref 26.0–34.0)
MCHC: 34.1 g/dL (ref 30.0–36.0)
MCV: 96.5 fL (ref 80.0–100.0)
Platelets: 330 10*3/uL (ref 150–400)
RBC: 3.46 MIL/uL — ABNORMAL LOW (ref 3.87–5.11)
RDW: 13.5 % (ref 11.5–15.5)
WBC: 15.1 10*3/uL — ABNORMAL HIGH (ref 4.0–10.5)
nRBC: 0 % (ref 0.0–0.2)

## 2020-11-14 LAB — RESP PANEL BY RT-PCR (FLU A&B, COVID) ARPGX2
Influenza A by PCR: NEGATIVE
Influenza B by PCR: NEGATIVE
SARS Coronavirus 2 by RT PCR: NEGATIVE

## 2020-11-14 MED ORDER — LACTATED RINGERS IV SOLN: 500.0000 mL | INTRAVENOUS | Status: DC | PRN

## 2020-11-14 MED ORDER — SOD CITRATE-CITRIC ACID 500-334 MG/5ML PO SOLN: 30.0000 mL | ORAL | Status: DC | PRN

## 2020-11-14 MED ORDER — MISOPROSTOL 50MCG HALF TABLET
ORAL_TABLET | ORAL | Status: AC
  Administered 2020-11-14: 50 ug
  Filled 2020-11-14: qty 1

## 2020-11-14 MED ORDER — ACETAMINOPHEN 325 MG PO TABS
650.0000 mg | ORAL_TABLET | ORAL | Status: DC | PRN
  Filled 2020-11-14: qty 2

## 2020-11-14 MED ORDER — SERTRALINE HCL 100 MG PO TABS
100.0000 mg | ORAL_TABLET | Freq: Every day | ORAL | Status: DC
  Administered 2020-11-15 – 2020-11-17 (×3): 100 mg via ORAL
  Filled 2020-11-14 (×3): qty 1

## 2020-11-14 MED ORDER — ONDANSETRON HCL 4 MG/2ML IJ SOLN
4.0000 mg | Freq: Four times a day (QID) | INTRAMUSCULAR | Status: DC | PRN
  Administered 2020-11-15 (×2): 4 mg via INTRAVENOUS
  Filled 2020-11-14 (×2): qty 2

## 2020-11-14 MED ORDER — LACTATED RINGERS IV SOLN: INTRAVENOUS | Status: DC

## 2020-11-14 MED ORDER — MISOPROSTOL 25 MCG QUARTER TABLET: 25.0000 ug | ORAL_TABLET | ORAL | Status: DC | PRN

## 2020-11-14 MED ORDER — LEVETIRACETAM 500 MG PO TABS
500.0000 mg | ORAL_TABLET | Freq: Two times a day (BID) | ORAL | Status: DC
  Administered 2020-11-14 – 2020-11-17 (×6): 500 mg via ORAL
  Filled 2020-11-14 (×8): qty 1

## 2020-11-14 MED ORDER — MISOPROSTOL 50MCG HALF TABLET
50.0000 ug | ORAL_TABLET | ORAL | Status: DC | PRN
  Administered 2020-11-14 – 2020-11-15 (×3): 50 ug via ORAL
  Filled 2020-11-14 (×3): qty 1

## 2020-11-14 MED ORDER — OXYTOCIN-SODIUM CHLORIDE 30-0.9 UT/500ML-% IV SOLN
2.5000 [IU]/h | INTRAVENOUS | Status: DC
  Filled 2020-11-14: qty 500

## 2020-11-14 MED ORDER — TERBUTALINE SULFATE 1 MG/ML IJ SOLN: 0.2500 mg | Freq: Once | INTRAMUSCULAR | Status: DC | PRN

## 2020-11-14 MED ORDER — OXYTOCIN BOLUS FROM INFUSION: 333.0000 mL | Freq: Once | INTRAVENOUS | Status: DC

## 2020-11-14 MED ORDER — LIDOCAINE HCL (PF) 1 % IJ SOLN: 30.0000 mL | INTRAMUSCULAR | Status: DC | PRN

## 2020-11-14 MED ORDER — VANCOMYCIN HCL IN DEXTROSE 1-5 GM/200ML-% IV SOLN
1000.0000 mg | Freq: Two times a day (BID) | INTRAVENOUS | Status: DC
  Administered 2020-11-14 – 2020-11-15 (×3): 1000 mg via INTRAVENOUS
  Filled 2020-11-14 (×3): qty 200

## 2020-11-14 NOTE — Progress Notes (Signed)
Sparrow [place]ac[place] is a 35 y.o. G1P0 at [redacted]w[redacted]d admitted for induction of labor due to FGR.  Subjective: Starting to feel some contractions.  Objective: BP 137/87   Pulse 72   Temp 97.6 F (36.4 C) (Axillary)   Resp 18   Ht 5\' 7"  (1.702 m)   Wt 80.8 kg   LMP 02/25/2020   BMI 27.91 kg/m  No intake/output data recorded. No intake/output data recorded.  FHT:  FHR: 140 bpm, variability: moderate,  accelerations:  Present,  decelerations:  Absent UC:   irregular SVE:   Dilation: Closed Effacement (%): 50 Station: -2 Exam by:: [email or web address]: Lab Results  Component Value Date   WBC 15.1 (H) 11/14/2020   HGB 11.4 (L) 11/14/2020   HCT 33.4 (L) 11/14/2020   MCV 96.5 11/14/2020   PLT 330 11/14/2020    Assessment / Plan: Induction of labor due to FGR  Labor:  Cervix remains closed after Cytotec #1. Will  give cytotec # 2. Continue to assess for foley bulb placement at next check  Fetal Wellbeing:  Category I  I/D:   GBS pos on Vanc  #Hx of syncope Patient has cardiologist at [facility]. Has implanted cardiac monitor. No events on most recent interrogation. Discussed with on call cardiologist. Per their assessment of patient's case/chart review does not need continuous cardiac monitoring.  [facility] 11/14/2020, 5:18 PM

## 2020-11-14 NOTE — H&P (Signed)
OBSTETRIC ADMISSION HISTORY AND PHYSICAL  Jane Tran is a 35 y.o. female G1P0 with IUP at [redacted]w[redacted]d by Early [place] presenting for IOL for FGR (10%ile). She reports +FMs, No LOF, no VB, no blurry vision, headaches or peripheral edema, and RUQ pain.  She plans on breast feeding. She states she signed BTL papers but is still deciding what she would like for birth control. Considering Nexplanon. She is sure that if she has a CS she wants a tubal at the same time.  She received her prenatal care at [facility]   Dating: By Early [place] --->  Estimated Date of Delivery: 11/26/20  Sono:   @[redacted]w[redacted]d , CWD, FGR (10%ile), cephalic presentation, posterior fundal lie, 2353g, 10% EFW   Prenatal History/Complications:  FGR (10%ile) GBS carrier  Penicillin allergy - had swelling of face Abnormal Brain MRI in 08/2020  (Care Everywhere) - hyperintensity in Right hippocampal gyrus (stable) Seizure disorder since childhood (on Keppra 500mg  BID in pregnancy)  Past Medical History: Past Medical History:  Diagnosis Date   Anxiety    Depression    Hypertension    Implantable loop recorder present    Seizures (HCC)     Past Surgical History: Past Surgical History:  Procedure Laterality Date   APPENDECTOMY     TENDON REPAIR  2014    Obstetrical History: OB History     Gravida  1   Para      Term      Preterm      AB      Living         SAB      IAB      Ectopic      Multiple      Live Births              Social History Social History   Socioeconomic History   Marital status: Divorced    Spouse name: Not on file   Number of children: Not on file   Years of education: Not on file   Highest education level: Not on file  Occupational History   Not on file  Tobacco Use   Smoking status: Every Day    Packs/day: 0.50    Types: Cigarettes   Smokeless tobacco: Never  Vaping Use   Vaping Use: Never used  Substance and Sexual Activity   Alcohol use: Not Currently   Drug use: Not  Currently    Types: Marijuana    Comment: TODAY- 11-12-2020   Sexual activity: Yes    Birth control/protection: None  Other Topics Concern   Not on file  Social History Narrative   Not on file   Social Determinants of Health   Financial Resource Strain: Not on file  Food Insecurity: Food Insecurity Present   Worried About Running Out of Food in the Last Year: Sometimes true   Ran Out of Food in the Last Year: Sometimes true  Transportation Needs: Unmet Transportation Needs   Lack of Transportation (Medical): Yes   Lack of Transportation (Non-Medical): Yes  Physical Activity: Not on file  Stress: Stress Concern Present   Feeling of Stress : Very much  Social Connections: Moderately Isolated   Frequency of Communication with Friends and Family: More than three times a week   Frequency of Social Gatherings with Friends and Family: Once a week   Attends Religious Services: Never   2015 or Organizations: No   Attends 11-14-2020 Meetings: Never   Marital Status:  Living with partner    Family History: Family History  Problem Relation Age of Onset   Diabetes Father    Cancer Maternal Grandmother     Allergies: Allergies  Allergen Reactions   Albuterol Other (See Comments) and Shortness Of Breath    States it cause her to hyperventlate States it cause her to hyperventlate    Methocarbamol Other (See Comments)    States it causes her to hyperventilate   Penicillins    Tramadol     Medications Prior to Admission  Medication Sig Dispense Refill Last Dose   aspirin EC 81 MG tablet Take 1 tablet (81 mg total) by mouth daily. Take after 12 weeks for prevention of preeclampsia later in pregnancy 300 tablet 2    folic acid (FOLVITE) 1 MG tablet Take 1 tablet (1 mg total) by mouth daily. 90 tablet 0    hydrOXYzine (ATARAX/VISTARIL) 25 MG tablet Take 1 tablet (25 mg total) by mouth every 6 (six) hours as needed for anxiety. 30 tablet 4    levETIRAcetam  (KEPPRA) 500 MG tablet Take 1 tablet (500 mg total) by mouth 2 (two) times daily. 90 tablet 1    Prenatal Vit-Fe Fumarate-FA (PRENATAL MULTIVITAMIN) TABS tablet Take 1 tablet by mouth daily at 12 noon.      sertraline (ZOLOFT) 100 MG tablet Take 1 tablet (100 mg total) by mouth daily. Take one tablet daily 30 tablet 12      Review of Systems   All systems reviewed and negative except as stated in HPI  Blood pressure 121/74, pulse 85, temperature 98.2 F (36.8 C), temperature source Oral, resp. rate 18, height 5\' 7"  (1.702 m), weight 80.8 kg, last menstrual period 02/25/2020. General appearance: alert Lungs: clear to auscultation bilaterally Heart: regular rate and rhythm Abdomen: soft, non-tender; bowel sounds normal Extremities: Homans sign is negative, no sign of DVT Presentation: cephalic Fetal monitoringBaseline: 135 bpm, Variability: Good {> 6 bpm), Accelerations: Reactive, and Decelerations: Absent Uterine activity: irregular      Prenatal labs: ABO, Rh: O/Positive/-- (10/19 1131) Antibody: Negative (10/19 1130) Rubella: 13.80 (05/05 0956) RPR: Non Reactive (10/19 1131)  HBsAg: Negative (05/05 0956)  HIV: Non Reactive (10/19 1131)  GBS: Positive/-- (10/19 1123)  1 hr Glucola normal Genetic screening  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  Patient Active Problem List   Diagnosis Date Noted   IUGR (intrauterine growth restriction) affecting care of mother 11/14/2020   Group B Streptococcus carrier, +RV culture, currently pregnant 11/09/2020   Red Chart Rounds  Patient 09/03/2020   Carrier of spinal muscular atrophy 06/04/2020   Abnormal brain MRI 05/20/2020   Syncopal episodes 05/20/2020   Supervision of high risk pregnancy, antepartum 05/11/2020   Anxiety 05/11/2020   AMA (advanced maternal age) multigravida 35+ 05/11/2020   Seizures (HCC) 04/22/2020   Anxiety and depression 12/21/2017   Tobacco abuse 04/12/2012   Marijuana abuse 04/12/2012    Assessment/Plan:  Jane Tran is a 35 y.o. G1P0 at [redacted]w[redacted]d here for IOL for FGR (10%ile)  #Labor: Closed cervix at check at admission, start on Cytotec [redacted]w[redacted]d buccal. At next check assess for foley bulb  placement. #Pain:Considering IV pain meds and epidural #FWB: Cat I tracing #ID:  GBS positive, PCN allergy, on Vancomycin #MOF: breast #MOC: signed BTL papers but is still deciding what she would like for birth control. Considering Nexplanon. She is sure that if she has a CS she wants a tubal at the same time. #Circ:  N/A  #Seizure disorder Has had SZD since childhood, has Production assistant, radio. On Keppra 500mg  BID. Last took this morning. Per neurology safe to labor - Continue Keppra 500mg  BID  #Abnormal MRI Last MRI in 08/2020 hyperintensity in Right hippocampal gyrus (stable from prior MRI) - per neurology ok to labor - follow up with neurology at [facility] for Syncope Patient with hx of unclear etiology seizures and some concern that these are syncopal episodes. Has implanted cardiac monitor (managed by Dr. 09/2020 at [place]- records in care everywhere)Prior interrogation patient with occassional runs of NSVT and palpitations however  Per last note most recent interrogation in 09/2020 of cardiac monitor unremarkable.  - monitor HR during labor and for cardiac symptoms   [place], MD, MPH OB Fellow, Faculty Practice

## 2020-11-14 NOTE — Progress Notes (Signed)
Labor Progress Note Jane Tran is a 35 y.o. G1P0 at [redacted]w[redacted]d presented for IOL due to FGR.  S: Doing well.   O:  BP (!) 110/59   Pulse 79   Temp 97.6 F (36.4 C) (Axillary)   Resp 18   Ht 5\' 7"  (1.702 m)   Wt 80.8 kg   LMP 02/25/2020   BMI 27.91 kg/m  EFM: 140/mod/15x15/none   CVE: Dilation: Fingertip Effacement (%): 80 Cervical Position: Posterior Station: -1 Presentation: Vertex Exam by:: [email or web address], RN   A&P: 35 y.o. G1P0 [redacted]w[redacted]d  #Labor: Minimal change since last check per RN. Would still benefit from additional Cytotec, #3. Pending on next check, could consider FB however if her cervix is significantly thinned may not benefit too much.  #Pain: PRN #FWB: Cat 1 , tolerating cytotec well  #GBS positive on Vanc-- clinda resistant and severe allergy to PCN   #History of syncope: HR currently WNL. Cont to monitor. Will be following up with cards postpartum.   #Seizure disorder: cont keppra. Last seizure several months ago.   #FGR: 2353g at 26w, 10%.     [redacted]w[redacted]d, DO 9:12 PM

## 2020-11-15 ENCOUNTER — Encounter (HOSPITAL_COMMUNITY): Payer: Self-pay | Admitting: Family Medicine

## 2020-11-15 ENCOUNTER — Inpatient Hospital Stay (HOSPITAL_COMMUNITY): Payer: Medicaid Other | Admitting: Anesthesiology

## 2020-11-15 DIAGNOSIS — O99824 Streptococcus B carrier state complicating childbirth: Secondary | ICD-10-CM

## 2020-11-15 DIAGNOSIS — Z3A38 38 weeks gestation of pregnancy: Secondary | ICD-10-CM

## 2020-11-15 DIAGNOSIS — O365930 Maternal care for other known or suspected poor fetal growth, third trimester, not applicable or unspecified: Secondary | ICD-10-CM

## 2020-11-15 LAB — RPR: RPR Ser Ql: NONREACTIVE

## 2020-11-15 MED ORDER — PHENYLEPHRINE 40 MCG/ML (10ML) SYRINGE FOR IV PUSH (FOR BLOOD PRESSURE SUPPORT): 80.0000 ug | PREFILLED_SYRINGE | INTRAVENOUS | Status: DC | PRN

## 2020-11-15 MED ORDER — PRENATAL MULTIVITAMIN CH
1.0000 | ORAL_TABLET | Freq: Every day | ORAL | Status: DC
  Administered 2020-11-16: 1 via ORAL
  Filled 2020-11-15 (×2): qty 1

## 2020-11-15 MED ORDER — SIMETHICONE 80 MG PO CHEW: 80.0000 mg | CHEWABLE_TABLET | ORAL | Status: DC | PRN

## 2020-11-15 MED ORDER — DIBUCAINE (PERIANAL) 1 % EX OINT: 1.0000 "application " | TOPICAL_OINTMENT | CUTANEOUS | Status: DC | PRN

## 2020-11-15 MED ORDER — OXYTOCIN-SODIUM CHLORIDE 30-0.9 UT/500ML-% IV SOLN
1.0000 m[IU]/min | INTRAVENOUS | Status: DC
  Administered 2020-11-15: 2 m[IU]/min via INTRAVENOUS

## 2020-11-15 MED ORDER — ACETAMINOPHEN 325 MG PO TABS: 650.0000 mg | ORAL_TABLET | ORAL | Status: DC | PRN

## 2020-11-15 MED ORDER — OXYCODONE HCL 5 MG PO TABS: 5.0000 mg | ORAL_TABLET | ORAL | Status: DC | PRN

## 2020-11-15 MED ORDER — TETANUS-DIPHTH-ACELL PERTUSSIS 5-2.5-18.5 LF-MCG/0.5 IM SUSY: 0.5000 mL | PREFILLED_SYRINGE | Freq: Once | INTRAMUSCULAR | Status: DC

## 2020-11-15 MED ORDER — DIPHENHYDRAMINE HCL 50 MG/ML IJ SOLN
12.5000 mg | INTRAMUSCULAR | Status: DC | PRN
Start: 2020-11-15 — End: 2020-11-15

## 2020-11-15 MED ORDER — LIDOCAINE-EPINEPHRINE (PF) 2 %-1:200000 IJ SOLN
INTRAMUSCULAR | Status: DC | PRN
  Administered 2020-11-15: 5 mL via EPIDURAL

## 2020-11-15 MED ORDER — WITCH HAZEL-GLYCERIN EX PADS: 1.0000 "application " | MEDICATED_PAD | CUTANEOUS | Status: DC | PRN

## 2020-11-15 MED ORDER — EPHEDRINE 5 MG/ML INJ: 10.0000 mg | INTRAVENOUS | Status: DC | PRN

## 2020-11-15 MED ORDER — HYDROXYZINE HCL 25 MG PO TABS
25.0000 mg | ORAL_TABLET | Freq: Three times a day (TID) | ORAL | Status: DC | PRN
  Administered 2020-11-15 – 2020-11-16 (×2): 25 mg via ORAL
  Filled 2020-11-15 (×2): qty 1

## 2020-11-15 MED ORDER — FENTANYL CITRATE (PF) 100 MCG/2ML IJ SOLN
INTRAMUSCULAR | Status: AC
  Filled 2020-11-15: qty 2

## 2020-11-15 MED ORDER — ONDANSETRON HCL 4 MG/2ML IJ SOLN: 4.0000 mg | INTRAMUSCULAR | Status: DC | PRN

## 2020-11-15 MED ORDER — ONDANSETRON HCL 4 MG PO TABS: 4.0000 mg | ORAL_TABLET | ORAL | Status: DC | PRN

## 2020-11-15 MED ORDER — ZOLPIDEM TARTRATE 5 MG PO TABS: 5.0000 mg | ORAL_TABLET | Freq: Every evening | ORAL | Status: DC | PRN

## 2020-11-15 MED ORDER — OXYCODONE HCL 5 MG PO TABS: 10.0000 mg | ORAL_TABLET | ORAL | Status: DC | PRN

## 2020-11-15 MED ORDER — COCONUT OIL OIL: 1.0000 "application " | TOPICAL_OIL | Status: DC | PRN

## 2020-11-15 MED ORDER — FENTANYL-BUPIVACAINE-NACL 0.5-0.125-0.9 MG/250ML-% EP SOLN
EPIDURAL | Status: AC
  Filled 2020-11-15: qty 250

## 2020-11-15 MED ORDER — BENZOCAINE-MENTHOL 20-0.5 % EX AERO: 1.0000 "application " | INHALATION_SPRAY | CUTANEOUS | Status: DC | PRN

## 2020-11-15 MED ORDER — TERBUTALINE SULFATE 1 MG/ML IJ SOLN: 0.2500 mg | Freq: Once | INTRAMUSCULAR | Status: DC | PRN

## 2020-11-15 MED ORDER — FENTANYL-BUPIVACAINE-NACL 0.5-0.125-0.9 MG/250ML-% EP SOLN
12.0000 mL/h | EPIDURAL | Status: DC | PRN
  Administered 2020-11-15: 12 mL/h via EPIDURAL

## 2020-11-15 MED ORDER — SENNOSIDES-DOCUSATE SODIUM 8.6-50 MG PO TABS
2.0000 | ORAL_TABLET | Freq: Every day | ORAL | Status: DC
  Administered 2020-11-16 – 2020-11-17 (×2): 2 via ORAL
  Filled 2020-11-15 (×2): qty 2

## 2020-11-15 MED ORDER — FENTANYL CITRATE (PF) 100 MCG/2ML IJ SOLN
50.0000 ug | INTRAMUSCULAR | Status: DC | PRN
  Administered 2020-11-15: 50 ug via INTRAVENOUS

## 2020-11-15 MED ORDER — DIPHENHYDRAMINE HCL 25 MG PO CAPS: 25.0000 mg | ORAL_CAPSULE | Freq: Four times a day (QID) | ORAL | Status: DC | PRN

## 2020-11-15 MED ORDER — IBUPROFEN 600 MG PO TABS
600.0000 mg | ORAL_TABLET | Freq: Four times a day (QID) | ORAL | Status: DC
  Administered 2020-11-16 – 2020-11-17 (×6): 600 mg via ORAL
  Filled 2020-11-15 (×7): qty 1

## 2020-11-15 MED ORDER — LACTATED RINGERS IV SOLN: 500.0000 mL | Freq: Once | INTRAVENOUS | Status: DC

## 2020-11-15 NOTE — Progress Notes (Signed)
Labor Progress Note Jane Tran is a 35 y.o. G1P0 at [redacted]w[redacted]d presented for IOL due to FGR.   S: Doing well, trying to rest. Feeling some contractions, not bothering her too much.   O:  BP 132/81   Pulse 69   Temp 97.6 F (36.4 C) (Oral)   Resp 16   Ht 5\' 7"  (1.702 m)   Wt 80.8 kg   LMP 02/25/2020   BMI 27.91 kg/m  EFM: 140/mod/15x15/none  CVE: Dilation: 1 Effacement (%): 80, 90 Cervical Position: Posterior Station: -2 Presentation: Vertex Exam by:: [email or web address], MD   A&P: 35 y.o. G1P0 [redacted]w[redacted]d  #Labor: Recently checked by RN about 1 hour ago, reassessed myself to see if she would benefit from a FB. Discussed with patient that given how thin her cervix is, it may come out quickly but would still likely help. She declined at this time and would like to transition to pit when able. Just received additional dose of cytotec at 0054 per RN, plan to switch to pit 2x2 when next due.  #Pain: PRN  #FWB: Cat 1  #GBS positive on Vanc   #History of syncope: HR currently WNL. Cont to monitor. Will be following up with cards postpartum.    #Seizure disorder: Cont keppra. Last seizure several months ago.    #FGR: 2353g at 26w, 10%.   [redacted]w[redacted]d, DO 2:07 AM

## 2020-11-15 NOTE — Anesthesia Preprocedure Evaluation (Addendum)
Anesthesia Evaluation  Patient identified by MRN, date of birth, ID band Patient awake    Reviewed: Allergy & Precautions, NPO status , Patient's Chart, lab work & pertinent test results  Airway Mallampati: II       Dental no notable dental hx.    Pulmonary Current SmokerPatient did not abstain from smoking.,    Pulmonary exam normal        Cardiovascular hypertension,  Rhythm:Regular Rate:Normal  Loop recorder Device Check 09/2020 Event/Full Report - Ind: Syncope; Episodes: 3 Symptoms; SR- no ectopy  noted. CS EPT  Sinus rhythm noted.     Neuro/Psych Seizures - (last seizure April 2022),  PSYCHIATRIC DISORDERS Anxiety Depression MRI 08/2020 IMPRESSION:  1. No acute process or significant interval change.  2. Area of abnormal T2-weighted hyperintensity in the RIGHT hippocampal gyrus is unchanged. Differential diagnosis remains the same and includes focal cortical dysplasia. However, a low-grade neoplasm is not excluded such as gangliocytoma or dysembryoplastic neuroepithelial tumor (DNET). Recommend an MRI of the brain without and with intravenous contrast 1 able to safely obtained.  3. Adjacent RIGHT hippocampal again appears normal to slightly enlarged, unchanged.  4. Several small areas of abnormal increased T2-weighted signal intensity in bilateral cerebral white matter, predominantly periventricular in location, unchanged. These findings are suspicious for a demyelinating process.      GI/Hepatic negative GI ROS, Neg liver ROS,   Endo/Other  negative endocrine ROS  Renal/GU negative Renal ROS  negative genitourinary   Musculoskeletal negative musculoskeletal ROS (+)   Abdominal Normal abdominal exam  (+)   Peds  Hematology negative hematology ROS (+)   Anesthesia Other Findings IOL for IUGR  Reproductive/Obstetrics (+) Pregnancy                            Anesthesia  Physical Anesthesia Plan  ASA: 3  Anesthesia Plan: Epidural   Post-op Pain Management:    Induction:   PONV Risk Score and Plan: Treatment may vary due to age or medical condition  Airway Management Planned: Natural Airway  Additional Equipment:   Intra-op Plan:   Post-operative Plan:   Informed Consent: I have reviewed the patients History and Physical, chart, labs and discussed the procedure including the risks, benefits and alternatives for the proposed anesthesia with the patient or authorized representative who has indicated his/her understanding and acceptance.       Plan Discussed with: Anesthesiologist  Anesthesia Plan Comments: (Patient identified. Risks, benefits, options discussed with patient including but not limited to bleeding, infection, nerve damage, paralysis, failed block, incomplete pain control, headache, blood pressure changes, nausea, vomiting, reactions to medication, itching, and post partum back pain. Confirmed with bedside nurse the patient's most recent platelet count. Confirmed with the patient that they are not taking any anticoagulation, have any bleeding history or any family history of bleeding disorders. Patient expressed understanding and wishes to proceed. All questions were answered. )        Anesthesia Quick Evaluation

## 2020-11-15 NOTE — Progress Notes (Signed)
LABOR PROGRESS NOTE  Jane Tran is a 35 y.o. G1P0 at [redacted]w[redacted]d  presented for IOL for FGR.  Subjective: Comfortable in bed. Amenable to FB placement given minimal change with cytotec.   Objective: BP 127/89   Pulse 66   Temp 98.8 F (37.1 C)   Resp 18   Ht 5\' 7"  (1.702 m)   Wt 80.8 kg   LMP 02/25/2020   BMI 27.91 kg/m  or  Vitals:   11/15/20 0841 11/15/20 0900 11/15/20 0929 11/15/20 1024  BP: 123/75 116/69 121/76 127/89  Pulse: 63 67  66  Resp: 18 17 16 18   Temp:      TempSrc:      Weight:      Height:       Dilation: 1 Effacement (%): 80 Cervical Position: Posterior Station: -2 Presentation: Vertex Exam by:: Savannah brown  RN Fetal monitoring: Baseline: 135 bpm, Variability: Good {> 6 bpm), Accelerations: absent, and Decelerations: Absent Uterine activity: Frequency: Every 2-3 minutes  Labs: Lab Results  Component Value Date   WBC 15.1 (H) 11/14/2020   HGB 11.4 (L) 11/14/2020   HCT 33.4 (L) 11/14/2020   MCV 96.5 11/14/2020   PLT 330 11/14/2020    Patient Active Problem List   Diagnosis Date Noted   IUGR (intrauterine growth restriction) affecting care of mother 11/14/2020   Group B Streptococcus carrier, +RV culture, currently pregnant 11/09/2020   Red Chart Rounds Patient 09/03/2020   Carrier of spinal muscular atrophy 06/04/2020   Abnormal brain MRI 05/20/2020   Syncopal episodes 05/20/2020   Supervision of high risk pregnancy, antepartum 05/11/2020   Anxiety 05/11/2020   AMA (advanced maternal age) multigravida 35+ 05/11/2020   Seizures (HCC) 04/22/2020   Anxiety and depression 12/21/2017   Tobacco abuse 04/12/2012   Marijuana abuse 04/12/2012    Assessment / Plan: 35 y.o. G1P0 at [redacted]w[redacted]d here for IOL for FGR.  Labor: Minimal progression. FB placed. Continue pit. Fetal Wellbeing:  Category I Pain Control:  PRN. Fentanyl 31 for FB placement. Epidural when necessary. Anticipated MOD:  Vaginal #GBS positive  #History of syncope: HR 60s. Cont  to monitor. Will be following up with cardiology postpartum. #Seizure disorder: Continue keppra.  [redacted]w[redacted]d, DO 11/15/2020, 10:50 AM PGY-1, [facility]

## 2020-11-15 NOTE — Progress Notes (Signed)
LABOR PROGRESS NOTE  Jane Tran is a 35 y.o. G1P0 at [redacted]w[redacted]d  presented for IOL for FGR.  Subjective: Uncomfortable even with epidural. Feeling pain on her left side that is worsened with contractions.  Objective: BP 136/83   Pulse 64   Temp 98 F (36.7 C) (Oral)   Resp 18   Ht 5\' 7"  (1.702 m)   Wt 80.8 kg   LMP 02/25/2020   SpO2 99%   BMI 27.91 kg/m  or  Vitals:   11/15/20 1730 11/15/20 1830 11/15/20 1900 11/15/20 1930  BP: 133/80 134/74 130/73 136/83  Pulse: 61 66 66 64  Resp: 18 16 18 18   Temp:    98 F (36.7 C)  TempSrc:    Oral  SpO2:      Weight:      Height:        Dilation: Lip/rim Effacement (%): 90 Cervical Position: Posterior Station: -1 Presentation: Vertex Exam by:: Dr. Fetal monitoring: Baseline: 135 bpm, Variability: Good {> 6 bpm), Accelerations: absent, and Decelerations: Absent Uterine activity: Frequency: Every 2-3 minutes not tracing well  Labs: Lab Results  Component Value Date   WBC 15.1 (H) 11/14/2020   HGB 11.4 (L) 11/14/2020   HCT 33.4 (L) 11/14/2020   MCV 96.5 11/14/2020   PLT 330 11/14/2020    Patient Active Problem List   Diagnosis Date Noted   IUGR (intrauterine growth restriction) affecting care of mother 11/14/2020   Group B Streptococcus carrier, +RV culture, currently pregnant 11/09/2020   Red Chart Rounds Patient 09/03/2020   Carrier of spinal muscular atrophy 06/04/2020   Abnormal brain MRI 05/20/2020   Syncopal episodes 05/20/2020   Supervision of high risk pregnancy, antepartum 05/11/2020   Anxiety 05/11/2020   AMA (advanced maternal age) multigravida 35+ 05/11/2020   Seizures (HCC) 04/22/2020   Anxiety and depression 12/21/2017   Tobacco abuse 04/12/2012   Marijuana abuse 04/12/2012    Assessment / Plan: 35 y.o. G1P0 at [redacted]w[redacted]d here for IOL for FGR.  Labor: IUPC unable to be placed due to head so low in station. Continue Fetal Wellbeing:  Category I Pain Control:  Epidural Anticipated MOD:   Vaginal #GBS positive > 31, DO 11/15/2020, 8:26 PM PGY-1, [facility]

## 2020-11-15 NOTE — Progress Notes (Addendum)
LABOR PROGRESS NOTE  Jane Tran is a 35 y.o. G1P0 at [redacted]w[redacted]d  presented for IOL for FGR.   Subjective: Comfortable in bed with epidural.   Objective: BP 133/80   Pulse 61   Temp 97.6 F (36.4 C) (Oral)   Resp 18   Ht 5\' 7"  (1.702 m)   Wt 80.8 kg   LMP 02/25/2020   SpO2 99%   BMI 27.91 kg/m  or  Vitals:   11/15/20 1635 11/15/20 1640 11/15/20 1700 11/15/20 1730  BP: 132/74 135/80 (!) 148/83 133/80  Pulse: 64 64 63 61  Resp:  18    Temp:  97.6 F (36.4 C)    TempSrc:  Oral    SpO2: 99% 99%    Weight:      Height:       Dilation: 6 Effacement (%): 80 Cervical Position: Posterior Station: -2 Presentation: Vertex Exam by:: [email or web address] RN Fetal monitoring: Baseline: 140 bpm, Variability: Good {> 6 bpm), Accelerations: absent, and Decelerations: Absent Uterine activity: None  Labs: Lab Results  Component Value Date   WBC 15.1 (H) 11/14/2020   HGB 11.4 (L) 11/14/2020   HCT 33.4 (L) 11/14/2020   MCV 96.5 11/14/2020   PLT 330 11/14/2020    Patient Active Problem List   Diagnosis Date Noted   IUGR (intrauterine growth restriction) affecting care of mother 11/14/2020   Group B Streptococcus carrier, +RV culture, currently pregnant 11/09/2020   Red Chart Rounds Patient 09/03/2020   Carrier of spinal muscular atrophy 06/04/2020   Abnormal brain MRI 05/20/2020   Syncopal episodes 05/20/2020   Supervision of high risk pregnancy, antepartum 05/11/2020   Anxiety 05/11/2020   AMA (advanced maternal age) multigravida 35 05/11/2020   Seizures (HCC) 04/22/2020   Anxiety and depression 12/21/2017   Tobacco abuse 04/12/2012   Marijuana abuse 04/12/2012    Assessment / Plan: 35 y.o. G1P0 at [redacted]w[redacted]d here for IOL for FGR.   Labor: AROM'd. Continue pitocin.  Fetal Wellbeing:  Category I, not tracing easily but with decent variability. Pain Control:  Epidural Anticipated MOD:  Vaginal #GBS positive > [redacted]w[redacted]d, DO 11/15/2020, 6:16 PM PGY-1, [facility]  Family [facility]

## 2020-11-15 NOTE — Progress Notes (Signed)
Labor Progress Note Jane Tran is a 35 y.o. G1P0 at [redacted]w[redacted]d presented for IOL d/t FGR.   O:  BP 140/83   Pulse 63   Temp 97.6 F (36.4 C) (Oral)   Resp 16   Ht 5\' 7"  (1.702 m)   Wt 80.8 kg   LMP 02/25/2020   BMI 27.91 kg/m  EFM: 140/mod/15x15  CVE: Dilation: 1 Effacement (%): 90 Cervical Position: Posterior Station: -2 Presentation: Vertex Exam by:: [email or web address], RN   A&P: 35 y.o. G1P0 [redacted]w[redacted]d  #Labor: Minimal progression with dilation since last check but cervix is quite thin. Declines FB. Will trial pit 2x2.  #Pain: PRN #FWB: Cat 1 #GBS positive  #History of syncope: HR 60's. Cont to monitor. Will be following up with cards postpartum.    #Seizure disorder: Cont keppra. Last seizure several months ago.    #FGR: 2353g at 26w, 10%.    [redacted]w[redacted]d, DO 5:43 AM

## 2020-11-15 NOTE — Progress Notes (Signed)
Jane Tran is a 35 y.o. G1P0 at [redacted]w[redacted]d admitted for induction of labor due to FGR (10%ile).  Subjective: Reports feeling pressure on left side.   Objective: BP 129/82   Pulse 63   Temp 98 F (36.7 C) (Oral)   Resp 18   Ht 5\' 7"  (1.702 m)   Wt 80.8 kg   LMP 02/25/2020   SpO2 99%   BMI 27.91 kg/m  No intake/output data recorded. No intake/output data recorded.  FHT:  FHR: 140 bpm, variability: moderate,  accelerations:  Present,  decelerations:  Present with contractions UC:   regular, every 1-3 minutes SVE:   Dilation: Lip/rim Effacement (%): 90 Station: -1 Exam by:: Dr. [email or web address]  Labs: Lab Results  Component Value Date   WBC 15.1 (H) 11/14/2020   HGB 11.4 (L) 11/14/2020   HCT 33.4 (L) 11/14/2020   MCV 96.5 11/14/2020   PLT 330 11/14/2020    Assessment / Plan: Induction of labor due to FGR,  progressing well on pitocin  Labor: Progressing normally, currently anterior lip on right side. Zero station. Continue on pitocin. Consider peanut ball for helping with descent  Fetal Wellbeing:  Category II some decelerations that are lates. Only intermittent. Moderate variability in between Pain Control:  Epidural  11/16/2020, MD, MPH OB Fellow, Faculty Practice

## 2020-11-15 NOTE — Care Management (Signed)
Consult for medication needs.  CM called and spoke to Selina Cooley with financial counselor here at [facility] and verified that patient does have active managed Medicaid with a 0$ copay for brand name.  CM made resident and [facility] aware.  CM informed resident  they can send scripts to [facility] pharmacy here at [facility] prior to discharge.     Gretchen Short RNC-MNN, BSN Transitions of [facility]

## 2020-11-15 NOTE — Discharge Summary (Signed)
Postpartum Discharge Summary     Patient Name: Jane Tran DOB: [birth year] MRN: [id number]  Date of admission: 11/14/2020 Delivery date:11/15/2020  Delivering provider: Christin Fudge  Date of discharge: 11/17/2020  Admitting diagnosis: IUGR (intrauterine growth restriction) affecting care of mother [O36.5990] Intrauterine pregnancy: [redacted]w[redacted]d    Secondary diagnosis:  Active Problems:   Supervision of high risk pregnancy, antepartum   Anxiety   AMA (advanced maternal age) multigravida 35+   Abnormal brain MRI   Syncopal episodes   Carrier of spinal muscular atrophy   Seizures ([place]   Red Chart Rounds Patient   Group B Streptococcus carrier, +RV culture, currently pregnant   IUGR (intrauterine growth restriction) affecting care of mother  Additional problems: None    Discharge diagnosis: Term Pregnancy Delivered                                              Post partum procedures: n/a Augmentation: AROM, Pitocin, Cytotec, and IP Foley Complications: None  Hospital course: Induction of Labor With Vaginal Delivery   35y.o. yo G1P0 at 374w3das admitted to the hospital 11/14/2020 for induction of labor.  Indication for induction:  FGR .  Patient had an uncomplicated labor course as follows: Membrane Rupture Time/Date: 6:03 PM ,11/15/2020   Delivery Method:Vaginal, Spontaneous  Episiotomy: None  Lacerations:  Labial;1st degree  Details of delivery can be found in separate delivery note.  Patient had a routine postpartum course. Patient is discharged home 11/17/20.  Newborn Data: Birth date:11/15/2020  Birth time:9:35 PM  Gender:Female  Living status:Living  Apgars:9 ,9  Weight:2665 g   Magnesium Sulfate received: No BMZ received: No Rhophylac:N/A MMR:N/A T-DaP:Given prenatally Flu: No Transfusion:No  Physical exam  Vitals:   11/16/20 0840 11/16/20 1700 11/16/20 2054 11/17/20 0534  BP:  123/77 125/75 101/65  Pulse:  66 70 62  Resp: '17 17 18 15   ' Temp: 98.3 F (36.8 C) 97.8 F (36.6 C)  97.7 F (36.5 C)  TempSrc: Oral  Oral Oral  SpO2:   98%   Weight:      Height:       General: alert, cooperative, and no distress Lochia: appropriate Uterine Fundus: firm Incision: N/A DVT Evaluation: No evidence of DVT seen on physical exam. Labs: Lab Results  Component Value Date   WBC 17.7 (H) 11/16/2020   HGB 10.0 (L) 11/16/2020   HCT 29.0 (L) 11/16/2020   MCV 95.7 11/16/2020   PLT 252 11/16/2020   CMP Latest Ref Rng & Units 05/20/2020  Glucose 65 - 99 mg/dL 86  BUN 6 - 20 mg/dL 8  Creatinine 0.57 - 1.00 mg/dL 0.49(L)  Sodium 134 - 144 mmol/L 133(L)  Potassium 3.5 - 5.2 mmol/L 4.1  Chloride 96 - 106 mmol/L 100  CO2 20 - 29 mmol/L 21  Calcium 8.7 - 10.2 mg/dL 9.1  Total Protein 6.0 - 8.5 g/dL 6.6  Total Bilirubin 0.0 - 1.2 mg/dL 0.3  Alkaline Phos 44 - 121 IU/L 44  AST 0 - 40 IU/L 15  ALT 0 - 32 IU/L 9   Edinburgh Score: Edinburgh Postnatal Depression Scale Screening Tool 11/16/2020  I have been able to laugh and see the funny side of things. (No Data)     After visit meds:  Allergies as of 11/17/2020       Reactions   Albuterol  Other (See Comments), Shortness Of Breath   States it cause her to hyperventlate States it cause her to hyperventlate   Methocarbamol Other (See Comments)   States it causes her to hyperventilate   Penicillins    Tramadol         Medication List     TAKE these medications    aspirin EC 81 MG tablet Take 1 tablet (81 mg total) by mouth daily. Take after 12 weeks for prevention of preeclampsia later in pregnancy   folic acid 1 MG tablet Commonly known as: FOLVITE Take 1 tablet (1 mg total) by mouth daily.   hydrOXYzine 25 MG tablet Commonly known as: ATARAX/VISTARIL Take 1 tablet (25 mg total) by mouth every 6 (six) hours as needed for anxiety.   ibuprofen 600 MG tablet Commonly known as: ADVIL Take 1 tablet (600 mg total) by mouth every 6 (six) hours.   levETIRAcetam 500 MG  tablet Commonly known as: KEPPRA Take 1 tablet (500 mg total) by mouth 2 (two) times daily.   oxyCODONE-acetaminophen 5-325 MG tablet Commonly known as: Percocet Take 1 tablet by mouth every 4 (four) hours as needed for severe pain.   prenatal multivitamin Tabs tablet Take 1 tablet by mouth daily at 12 noon.   sertraline 100 MG tablet Commonly known as: ZOLOFT Take 1 tablet (100 mg total) by mouth daily. Take one tablet daily         Discharge home in stable condition Infant Feeding: Bottle Infant Disposition:home with mother Discharge instruction: per After Visit Summary and Postpartum booklet. Activity: Advance as tolerated. Pelvic rest for 6 weeks.  Diet: routine diet Future Appointments: Future Appointments  Date Time Provider [place]  11/22/2020 11:15 AM [facility]-MM CARE MANAGER [facility] None  12/15/2020 10:35 AM Clarnce Flock, MD [facility]   Follow up Visit:  [place] for [facility] Follow up.   Specialty: Obstetrics and Gynecology Why: In 4 weeks for a postpartum appt Contact information: [place] [address] [phone]                 Please schedule this patient for a In person postpartum visit in 4 weeks with the following provider: Any provider. Additional Postpartum F/U:  High risk pregnancy complicated by: FGR Delivery mode:  Vaginal, Spontaneous  Anticipated Birth Control:  POPs   11/17/2020 Wende Mott, CNM

## 2020-11-15 NOTE — Lactation Note (Signed)
This note was copied from a baby's chart. Lactation Consultation Note  Patient Name: Jane Tran [id number] Date: 11/15/2020 Reason for consult: L&D Initial assessment;Early term 37-38.6wks Age:35 hours LC entered the room, mom was doing skin to skin with infant. Mom latched infant on her right breast using the football hold position using the football hold position, infant latched with depth and breastfeed for 15 minutes, infant was still breastfeeding when [facility] left the room.  Mom knows to breastfeed infant according to feeding cues, 8 to 12+ or more times within 24 hours , skin to skin. Mom knows to call RN/LC for further latch assistance if needed. Maternal Data Has patient been taught Hand Expression?: No Does the patient have breastfeeding experience prior to this delivery?: No  Feeding Mother's Current Feeding Choice: Breast Milk  LATCH Score Latch: Grasps breast easily, tongue down, lips flanged, rhythmical sucking.  Audible Swallowing: Spontaneous and intermittent  Type of Nipple: Everted at rest and after stimulation  Comfort (Breast/Nipple): Soft / non-tender  Hold (Positioning): Assistance needed to correctly position infant at breast and maintain latch.  LATCH Score: 9   Lactation Tools Discussed/Used    Interventions Interventions: Assisted with latch;Skin to skin;Breast compression;Adjust position;Support pillows;Position options;Expressed milk;Education  Discharge Pump: Personal WIC Program: Yes  Consult Status Consult Status: Follow-up from L&D    Danelle Earthly 11/15/2020, 10:54 PM

## 2020-11-15 NOTE — Anesthesia Procedure Notes (Signed)
Epidural Patient location during procedure: OB Start time: 11/15/2020 4:00 PM End time: 11/15/2020 4:10 PM  Staffing Anesthesiologist: Elmer Picker, MD Performed: anesthesiologist   Preanesthetic Checklist Completed: patient identified, IV checked, risks and benefits discussed, monitors and equipment checked, pre-op evaluation and timeout performed  Epidural Patient position: sitting Prep: DuraPrep and site prepped and draped Patient monitoring: continuous pulse ox, blood pressure, heart rate and cardiac monitor Approach: midline Location: L3-L4 Injection technique: LOR air  Needle:  Needle type: Tuohy  Needle gauge: 17 G Needle length: 9 cm Needle insertion depth: 6 cm Catheter type: closed end flexible Catheter size: 19 Gauge Catheter at skin depth: 11 cm Test dose: negative  Assessment Sensory level: T8 Events: blood not aspirated, injection not painful, no injection resistance, no paresthesia and negative IV test  Additional Notes Patient identified. Risks/Benefits/Options discussed with patient including but not limited to bleeding, infection, nerve damage, paralysis, failed block, incomplete pain control, headache, blood pressure changes, nausea, vomiting, reactions to medication both or allergic, itching and postpartum back pain. Confirmed with bedside nurse the patient's most recent platelet count. Confirmed with patient that they are not currently taking any anticoagulation, have any bleeding history or any family history of bleeding disorders. Patient expressed understanding and wished to proceed. All questions were answered. Sterile technique was used throughout the entire procedure. Please see nursing notes for vital signs. Test dose was given through epidural catheter and negative prior to continuing to dose epidural or start infusion. Warning signs of high block given to the patient including shortness of breath, tingling/numbness in hands, complete motor block,  or any concerning symptoms with instructions to call for help. Patient was given instructions on fall risk and not to get out of bed. All questions and concerns addressed with instructions to call with any issues or inadequate analgesia.  Reason for block:procedure for pain

## 2020-11-16 ENCOUNTER — Other Ambulatory Visit: Payer: Self-pay

## 2020-11-16 LAB — CBC
HCT: 29 % — ABNORMAL LOW (ref 36.0–46.0)
Hemoglobin: 10 g/dL — ABNORMAL LOW (ref 12.0–15.0)
MCH: 33 pg (ref 26.0–34.0)
MCHC: 34.5 g/dL (ref 30.0–36.0)
MCV: 95.7 fL (ref 80.0–100.0)
Platelets: 252 10*3/uL (ref 150–400)
RBC: 3.03 MIL/uL — ABNORMAL LOW (ref 3.87–5.11)
RDW: 13.3 % (ref 11.5–15.5)
WBC: 17.7 10*3/uL — ABNORMAL HIGH (ref 4.0–10.5)
nRBC: 0 % (ref 0.0–0.2)

## 2020-11-16 NOTE — Anesthesia Postprocedure Evaluation (Signed)
Anesthesia Post Note  Patient: Jane Tran  Procedure(s) Performed: AN AD HOC LABOR EPIDURAL     Patient location during evaluation: Mother Baby Anesthesia Type: Epidural Level of consciousness: awake, oriented and awake and alert (RN notified both patient and support person sitting and sleeping swaying and appear they could fall on floor. Told RN it appears they could be in a state where they used drugs and may be unsafe to discharge baby in their care. RN said Child psychotherapist involve) Pain management: pain level controlled Vital Signs Assessment: post-procedure vital signs reviewed and stable Respiratory status: spontaneous breathing, respiratory function stable and nonlabored ventilation Cardiovascular status: stable Postop Assessment: adequate PO intake, no headache, patient able to bend at knees, able to ambulate and no apparent nausea or vomiting Anesthetic complications: no   No notable events documented.  Last Vitals:  Vitals:   11/16/20 0028 11/16/20 0029  BP: (!) 142/81 106/61  Pulse:    Resp: 20   Temp: 37.1 C   SpO2:      Last Pain:  Vitals:   11/16/20 0740  TempSrc:   PainSc: 0-No pain   Pain Goal:                   Jane Tran

## 2020-11-16 NOTE — Progress Notes (Signed)
Post Partum Day 1 Subjective: She is feeling well and does not endorse any pain. Ambulating and urinating without difficulty. Tolerating oral intake without nausea or vomiting. States she would like to go home today if her baby will be able to go home as well.  Objective: Blood pressure 106/61, pulse 66, temperature 98.7 F (37.1 C), temperature source Oral, resp. rate 20, height 5\' 7"  (1.702 m), weight 80.8 kg, last menstrual period 02/25/2020, SpO2 98 %, unknown if currently breastfeeding.  Physical Exam:  General: alert, cooperative, and no distress Lochia: appropriate Uterine Fundus: firm Incision: none DVT Evaluation: No evidence of DVT seen on physical exam.  Recent Labs    11/14/20 1102 11/16/20 0608  HGB 11.4* 10.0*  HCT 33.4* 29.0*    Assessment/Plan: Jane Tran is a 35 year old G1P1 on PPD#1 who is recovering well after delivery and is meeting all goals. -Slynd for contraception -Anticipate discharge in the next 24-48 hrs   LOS: 2 days   31 11/16/2020, 7:43 AM

## 2020-11-16 NOTE — Clinical Social Work Maternal (Signed)
CLINICAL SOCIAL WORK MATERNAL/CHILD NOTE  Patient Details  Name: Jane Tran MRN: [id number] Date of Birth: Oct 07, 1985  Date:  11/16/2020  Clinical Social Worker Initiating Note:  Abundio Miu, [place] Date/Time: Initiated:  11/16/20/1505     Child's Name:  Jane Tran   Biological Parents:  Mother, Father (Father: Jane Tran)   Need for Interpreter:  None   Reason for Referral:  Behavioral Health Concerns, Current Substance Use/Substance Use During Pregnancy     Address:  [place] [address]    Phone number:  [phone] (home)     Additional phone number:   Household Members/Support Persons (HM/SP):   Household Member/Support Person 1   HM/SP Name Relationship DOB or Age  HM/SP -1 Jane Tran FOB 12/09/1981  HM/SP -2        HM/SP -3        HM/SP -4        HM/SP -5        HM/SP -6        HM/SP -7        HM/SP -8          Natural Supports (not living in the home):  Immediate Family, Extended Family   Professional Supports: Therapist   Employment: Unemployed   Type of Work:     Education:  Public librarian arranged:    Museum/gallery curator Resources:  [employer]   Other Resources:  [employer], Physicist, medical     Cultural/Religious Considerations Which May Impact Care:    Strengths:  Ability to meet basic needs  , Home prepared for child     Psychotropic Medications:         Pediatrician:       Pediatrician List:   [place]      Pediatrician Fax Number:    Risk Factors/Current Problems:  Substance Use  , Mental Health Concerns     Cognitive State:  Able to Concentrate  , Alert  , Goal Oriented  , Linear Thinking  , Insightful     Mood/Affect:  Agitated  , Interested  , Comfortable  , Calm     CSW Assessment: CSW met with MOB at bedside to complete assessment, FOB and MOB's siblings present. CSW introduced self and asked to speak with  MOB privately, everyone left the room. CSW explained role. MOB was welcoming, pleasant, open, and engaged during assessment. MOB reported that she resides with FOB. MOB reported that she receives both [facility] and food stamps and has all items needed to care for infant including a car seat and 2 basinets. CSW inquired about MOB's support system, MOB reported that FOB, FOB's family and her brother are supports.   CSW inquired about MOB's mental health history. MOB reported that she has been experiencing depression and anxiety for 20 years. MOB reported that she recently obtained insurance during pregnancy and got started on medication. MOB reported that they recently increased her Zoloft dosage and prescribed an emergency anxiety use medication which is helpful. MOB reported that she has been participating in therapy through her OB office and plans to follow up with another provider to continue therapy. MOB reported that she called her insurance company and requested therapy resources and is still waiting on them to be sent. CSW agreed to provide therapy resources for MOB to have for now. CSW inquired  about how MOB was feeling emotionally since giving birth, MOB reported that she was feeling really anxious because there is a lot going on. MOB reported that she requested her emergency anxiety medication last night but did not receive it. MOB confirmed that she has been taking her Zoloft while admitted and that it has been helpful. MOB presented calm initially then later got upset when CSW explained hospital drug screen policy. MOB did not demonstrate any acute mental health signs/symptoms. CSW assessed for safety, MOB denied SI, HI, and domestic violence.   CSW provided education regarding the baby blues period vs. perinatal mood disorders, discussed treatment and gave resources for mental health follow up if concerns arise.  CSW recommends self-evaluation during the postpartum time period using the New Mom Checklist  from Postpartum Progress and encouraged MOB to contact a medical professional if symptoms are noted at any time.    CSW provided review of Sudden Infant Death Syndrome (SIDS) precautions.    CSW informed MOB about the hospital drug screen policy due to substance use during pregnancy. MOB confirmed marijuana use and reported last use as a couple days before delivery. MOB denied any other substance use. CSW informed MOB that infant's UDS and CDS would be monitored and a CPS report would be made if warranted. MOB became upset and no longer wanted to speak with CSW. MOB reported that she did not need therapy resources from [place] identifies no further need for intervention and no barriers to discharge at this time.   CSW Plan/Description:  Sudden Infant Death Syndrome (SIDS) Education, No Further Intervention Required/No Barriers to Discharge, Perinatal Mood and Anxiety Disorder (PMADs) Education, [place], CSW Will Continue to Monitor Umbilical Cord Tissue Drug Screen Results and Make Report if Barbette Or, LCSW 11/16/2020, 3:07 PM

## 2020-11-16 NOTE — Patient Outreach (Signed)
Care Coordination  11/16/2020  Ivey Nembhard 31-Jan-1985 [id number]  Danaiya Steadman is currently admitted as an inpatient at [facility]. The [facility] Managed Care team will follow the progress of Mickey Hebel and follow up upon discharge.    Estanislado Emms RN, BSN [place]  [facility]

## 2020-11-16 NOTE — Lactation Note (Signed)
This note was copied from a baby's chart. Lactation Consultation Note  Patient Name: Jane Tran PVXYI'[id number] Date: 11/16/2020   Age:35 hours P1, ETI less than 6 lbs. LC entered the room, per mom, she has decided to formula feed only. LC discussed if she changes her mind and would like to breastfeed [facility] services is here to assist her with breastfeeding.  Maternal Data    Feeding    LATCH Score                    Lactation Tools Discussed/Used    Interventions    Discharge    Consult Status      Danelle Earthly 11/16/2020, 4:14 PM

## 2020-11-17 MED ORDER — IBUPROFEN 600 MG PO TABS: 600.0000 mg | ORAL_TABLET | Freq: Four times a day (QID) | ORAL | 0 refills | Status: DC

## 2020-11-17 MED ORDER — OXYCODONE-ACETAMINOPHEN 5-325 MG PO TABS: 1.0000 | ORAL_TABLET | ORAL | 0 refills | Status: DC | PRN

## 2020-11-18 ENCOUNTER — Telehealth: Payer: Self-pay

## 2020-11-18 NOTE — Telephone Encounter (Signed)
Transition Care Management Unsuccessful Follow-up Telephone Call  Date of discharge and from where:  11/17/2020 from [facility]  Attempts:  1st Attempt  Reason for unsuccessful TCM follow-up call:  Left voice message

## 2020-11-19 ENCOUNTER — Ambulatory Visit: Payer: Medicaid Other

## 2020-11-19 ENCOUNTER — Telehealth: Payer: Self-pay | Admitting: Clinical

## 2020-11-19 NOTE — Telephone Encounter (Signed)
Transition Care Management Follow-up Telephone Call Date of discharge and from where: 11/17/2020-[facility]  How have you been since you were released from the hospital? Patient stated she is doing fine.  Any questions or concerns? No  Items Reviewed: Did the pt receive and understand the discharge instructions provided? Yes  Medications obtained and verified? Yes  Other? No  Any new allergies since your discharge? No  Dietary orders reviewed? No Do you have support at home? Yes   Home Care and Equipment/Supplies: Were home health services ordered? not applicable If so, what is the name of the agency? N/A  Has the agency set up a time to come to the patient's home? not applicable Were any new equipment or medical supplies ordered?  No What is the name of the medical supply agency? N/A Were you able to get the supplies/equipment? not applicable Do you have any questions related to the use of the equipment or supplies? No  Functional Questionnaire: (I = Independent and D = Dependent) ADLs: I  Bathing/Dressing- I  Meal Prep- I  Eating- I  Maintaining continence- I  Transferring/Ambulation- I  Managing Meds- I  Follow up appointments reviewed:  PCP Hospital f/u appt confirmed? No   Specialist Hospital f/u appt confirmed? No   Are transportation arrangements needed? No  If their condition worsens, is the pt aware to call PCP or go to the Emergency Dept.? Yes Was the patient provided with contact information for the PCP's office or ED? Yes Was to pt encouraged to call back with questions or concerns? Yes

## 2020-11-19 NOTE — Telephone Encounter (Signed)
Left HIPPA-compliant message to call back Wrenley Sayed from [facility] at  [phone] (Pattye Meda's office).   

## 2020-11-22 ENCOUNTER — Inpatient Hospital Stay (HOSPITAL_COMMUNITY): Payer: Medicaid Other

## 2020-11-22 ENCOUNTER — Inpatient Hospital Stay (HOSPITAL_COMMUNITY)
Admission: AD | Admit: 2020-11-22 | Payer: Medicaid Other | Source: Home / Self Care | Admitting: Obstetrics & Gynecology

## 2020-11-22 ENCOUNTER — Other Ambulatory Visit: Payer: Self-pay | Admitting: *Deleted

## 2020-11-22 NOTE — Patient Outreach (Signed)
Care Coordination  11/22/2020  Jane Tran [birth year] [id number]   Medicaid Managed Care   Unsuccessful Outreach Note  11/22/2020 Name: Jane Tran MRN: [id number] DOB: 1985-08-29  Referred by: Havery Moros, MD Reason for referral : High Risk Managed Medicaid (Unsuccessful RNCM follow up telephone outreach)   An unsuccessful telephone outreach was attempted today. The patient was referred to the case management team for assistance with care management and care coordination.   Follow Up Plan: A HIPAA compliant phone message was left for the patient providing contact information and requesting a return call.   Estanislado Emms RN, BSN [place]  [facility]

## 2020-11-22 NOTE — Patient Instructions (Signed)
Visit Information  Ms. [name]  - as a part of your Medicaid benefit, you are eligible for care management and care coordination services at no cost or copay. I was unable to reach you by phone today but would be happy to help you with your health related needs. Please feel free to call me @ [phone]   A member of the Managed Medicaid care management team will reach out to you again over the next 7 days.   Estanislado Emms RN, BSN   [facility]

## 2020-11-25 ENCOUNTER — Other Ambulatory Visit: Payer: Self-pay | Admitting: *Deleted

## 2020-11-25 NOTE — Patient Instructions (Signed)
Visit Information  Ms. Robin Mayotte  - as a part of your Medicaid benefit, you are eligible for care management and care coordination services at no cost or copay. I was unable to reach you by phone today but would be happy to help you with your health related needs. Please feel free to call me @ [phone]   A member of the Managed Medicaid care management team will reach out to you again over the next 7 days.   Leilah Polimeni RN, BSN [place]  [facility] RN Care Coordinator   

## 2020-11-25 NOTE — Patient Outreach (Signed)
Care Coordination  11/25/2020  Yassmin Binegar 09-Aug-1985 [id number]   Medicaid Managed Care   Unsuccessful Outreach Note  11/25/2020 Name: Camella Seim MRN: [id number] DOB: 1985/03/31  Referred by: Havery Moros, MD Reason for referral : High Risk Managed Medicaid (Unsuccessful RNCM follow up outreach)   A second unsuccessful telephone outreach was attempted today. The patient was referred to the case management team for assistance with care management and care coordination.   Follow Up Plan: A HIPAA compliant phone message was left for the patient providing contact information and requesting a return call.   Estanislado Emms RN, BSN [place]  [facility]

## 2020-11-29 ENCOUNTER — Telehealth: Payer: Self-pay | Admitting: Clinical

## 2020-11-29 NOTE — Telephone Encounter (Signed)
Brief mood check postpartum; pt is doing well, taking BH medication as prescribed, aware of upcoming appointments for case management, obgyn postpartum check up, cardiology; will call [name] at [phone] to schedule follow up as needed.

## 2020-11-30 ENCOUNTER — Telehealth (HOSPITAL_COMMUNITY): Payer: Self-pay

## 2020-11-30 NOTE — Telephone Encounter (Signed)
"  I'm doing good. Do the stitches dissolve on there own or do I have them removed?" RN told patient that the stitches in her perineum will dissolve on there own as she heals. RN reviewed healing process and peri care. "Is it normal to still be bleeding?" RN reviewed normal lochia amounts, color, and duration of bleeding. RN also reviewed what to much bleeding looks like and when to contact the provider. Patient has no other questions or concerns about her healing.  "She's an angel during the day and up at night. She is perfect though, she's the best. She sleeps in a bassinet."  RN reviewed ABC's of safe sleep with patient. Patient declines any questions or concerns about baby.  EPDS score is 6.  Marcelino Duster [facility] 11/30/2020,1825

## 2020-12-02 ENCOUNTER — Other Ambulatory Visit: Payer: Self-pay | Admitting: *Deleted

## 2020-12-02 NOTE — Patient Outreach (Signed)
Care Coordination  12/02/2020  [name] [birth year] [id number]   Medicaid Managed Care   Unsuccessful Outreach Note  12/02/2020 Name: Jane Tran MRN: [id number] DOB: [birth year]  Referred by: Havery Moros, MD Reason for referral : High Risk Managed Medicaid (Unsuccessful RNCM follow up outreach, 3rd attempt)   Third unsuccessful telephone outreach was attempted today. The patient was referred to the case management team for assistance with care management and care coordination. The patient's primary care provider has been notified of our unsuccessful attempts to make or maintain contact with the patient. The care management team is pleased to engage with this patient at any time in the future should he/she be interested in assistance from the care management team.   Follow Up Plan: We have been unable to make contact with the patient for follow up. The care management team is available to follow up with the patient after provider conversation with the patient regarding recommendation for care management engagement and subsequent re-referral to the care management team.   Estanislado Emms RN, BSN [place]  [facility] RN Care Coordinator

## 2020-12-06 DIAGNOSIS — G9389 Other specified disorders of brain: Secondary | ICD-10-CM | POA: Diagnosis not present

## 2020-12-06 DIAGNOSIS — R55 Syncope and collapse: Secondary | ICD-10-CM | POA: Diagnosis not present

## 2020-12-06 DIAGNOSIS — G40219 Localization-related (focal) (partial) symptomatic epilepsy and epileptic syndromes with complex partial seizures, intractable, without status epilepticus: Secondary | ICD-10-CM | POA: Diagnosis not present

## 2020-12-15 ENCOUNTER — Ambulatory Visit (INDEPENDENT_AMBULATORY_CARE_PROVIDER_SITE_OTHER): Payer: Medicaid Other | Admitting: Family Medicine

## 2020-12-15 ENCOUNTER — Other Ambulatory Visit: Payer: Self-pay

## 2020-12-15 DIAGNOSIS — F32A Depression, unspecified: Secondary | ICD-10-CM

## 2020-12-15 DIAGNOSIS — F419 Anxiety disorder, unspecified: Secondary | ICD-10-CM | POA: Diagnosis not present

## 2020-12-15 DIAGNOSIS — R9089 Other abnormal findings on diagnostic imaging of central nervous system: Secondary | ICD-10-CM

## 2020-12-15 DIAGNOSIS — Z720 Tobacco use: Secondary | ICD-10-CM | POA: Diagnosis not present

## 2020-12-15 DIAGNOSIS — R55 Syncope and collapse: Secondary | ICD-10-CM | POA: Diagnosis not present

## 2020-12-15 NOTE — Progress Notes (Signed)
Mb ref    Post Partum Visit Note  Jane Tran is a 35 y.o. G32P1001 female who presents for a postpartum visit. She is 4 weeks postpartum following a normal spontaneous vaginal delivery.  I have fully reviewed the prenatal and intrapartum course. The delivery was at [redacted]w[redacted]d gestational weeks.  Anesthesia: epidural. Postpartum course has been uneventful. Baby is doing well. Baby is feeding by bottle - Enfamil Neuropro . Bleeding staining only. Bowel function is normal. Bladder function is normal. Patient is sexually active. Contraception method is none. Postpartum depression screening: positive.   The pregnancy intention screening data noted above was reviewed. Potential methods of contraception were discussed. The patient elected to proceed with No data recorded.   Edinburgh Postnatal Depression Scale - 12/15/20 1045       Edinburgh Postnatal Depression Scale:  In the Past 7 Days   I have been able to laugh and see the funny side of things. 0    I have looked forward with enjoyment to things. 1    I have blamed myself unnecessarily when things went wrong. 3    I have been anxious or worried for no good reason. 2    I have felt scared or panicky for no good reason. 2    Things have been getting on top of me. 0    I have been so unhappy that I have had difficulty sleeping. 2    I have felt sad or miserable. 1    I have been so unhappy that I have been crying. 1    The thought of harming myself has occurred to me. 0    Edinburgh Postnatal Depression Scale Total 12             Health Maintenance Due  Topic Date Due   Pneumococcal Vaccine 48-46 Years old (1 - PCV) Never done    The following portions of the patient's history were reviewed and updated as appropriate: allergies, current medications, past family history, past medical history, past social history, past surgical history, and problem list.  Review of Systems Pertinent items noted in HPI and remainder of comprehensive ROS  otherwise negative.  Objective:  BP 108/76   Pulse 82   Wt 147 lb (66.7 kg)   LMP 02/25/2020   Breastfeeding No   BMI 23.02 kg/m    General:  alert, cooperative, and appears stated age   Breasts:  not indicated  Lungs: Comfortable on room air  GU exam:   Normal, small amount of remaining suture present on R labia, trimmed atraumatically       Assessment:    There are no diagnoses linked to this encounter.  Normal postpartum exam.   Plan:   Essential components of care per ACOG recommendations:  1.  Mood and well being: Patient with positive depression screening today. Reviewed local resources for support. Referred to [facility].  - Patient tobacco use? Yes. No.   - hx of drug use? No.    2. Infant care and feeding:  -Patient currently breastmilk feeding? No.  -Social determinants of health (SDOH) reviewed in EPIC. See chart.   3. Sexuality, contraception and birth spacing - Patient does not want a pregnancy in the next year.  Desired family size is 1 children.  - Reviewed forms of contraception in tiered fashion.  - Reviewed options, at start of conversation interested in oral contraceptives, however given POP's have low reliability and OCP's are contraindicated given her smoking status and age I recommended  against this. - We reviewed nexplanon, IUD, depo. Declines depo due to long return to fertility with this method. Not interested in IUD. - Ultimately decided on Nexplanon after counseling. Had amenorrhea with depo, discussed this will likely also be the case with Nexplanon. - Unprotected sex with pull out method on Thanksgiving one week prior, stressed importance of no unprotected sex until she returns in 2 weeks for Nexplanon insertion. Given condoms.  - Discussed birth spacing of 18 months  4. Sleep and fatigue -Encouraged family/partner/community support of 4 hrs of uninterrupted sleep to help with mood and fatigue  5. Physical Recovery  - Discussed patients delivery  and complications. She describes her labor as good. - Patient had a Vaginal, no problems at delivery. Patient had a 1st degree laceration. Perineal healing reviewed. Patient expressed understanding - Patient has urinary incontinence? No. - Patient is safe to resume physical and sexual activity  6.  Health Maintenance - HM due items addressed No - up to date - Last pap smear  Diagnosis  Date Value Ref Range Status  05/20/2020   Final   - Negative for intraepithelial lesion or malignancy (NILM)   Pap smear not done at today's visit.  -Breast Cancer screening indicated? No.   7. Chronic Disease/Pregnancy Condition follow up:  Abnormal MRI - following w [facility], plan for follow up brain MRI, on Keppra 1g BID, last saw them 12/06/2020 - following w [facility] for palpitations/syncope, next appt 12/20/20 - PCP follow up  Venora Maples, MD [facility] for [facility], [facility]

## 2020-12-31 ENCOUNTER — Encounter: Payer: Self-pay | Admitting: Family Medicine

## 2021-01-04 ENCOUNTER — Other Ambulatory Visit: Payer: Self-pay

## 2021-01-04 ENCOUNTER — Encounter: Payer: Self-pay | Admitting: Family Medicine

## 2021-01-04 ENCOUNTER — Ambulatory Visit (INDEPENDENT_AMBULATORY_CARE_PROVIDER_SITE_OTHER): Payer: Medicaid Other | Admitting: Family Medicine

## 2021-01-04 VITALS — BP 122/80 | HR 81 | Wt 145.0 lb

## 2021-01-04 DIAGNOSIS — Z3202 Encounter for pregnancy test, result negative: Secondary | ICD-10-CM

## 2021-01-04 DIAGNOSIS — Z30017 Encounter for initial prescription of implantable subdermal contraceptive: Secondary | ICD-10-CM | POA: Diagnosis not present

## 2021-01-04 DIAGNOSIS — F419 Anxiety disorder, unspecified: Secondary | ICD-10-CM

## 2021-01-04 LAB — POCT PREGNANCY, URINE: Preg Test, Ur: NEGATIVE

## 2021-01-04 MED ORDER — ETONOGESTREL 68 MG ~~LOC~~ IMPL
68.0000 mg | DRUG_IMPLANT | Freq: Once | SUBCUTANEOUS | Status: AC
  Administered 2021-01-04: 15:00:00 68 mg via SUBCUTANEOUS

## 2021-01-04 NOTE — Addendum Note (Signed)
Addended by: Aviva Signs on: 01/04/2021 03:47 PM   Modules accepted: Orders

## 2021-01-04 NOTE — Addendum Note (Signed)
Addended by: Denyce Robert E on: 01/04/2021 03:03 PM   Modules accepted: Orders

## 2021-01-04 NOTE — Progress Notes (Signed)
° ° ° °  GYNECOLOGY OFFICE PROCEDURE NOTE  Jane Tran is a 35 y.o. G1P1001 here for Nexplanon insertion.  Last pap smear was on 05/17/2020 and was normal. No other gynecologic concerns.  Nexplanon insertion Procedure Patient identified, informed consent performed, consent signed.   Patient does understand that irregular bleeding is a very common side effect of this medication. She was advised to have backup contraception for one week after placement. Pregnancy test in clinic today was negative.  Appropriate time out taken.  Patient's right arm was prepped and draped in the usual sterile fashion. The ruler used to measure and mark insertion area.  Patient was prepped with alcohol swab and then injected with 3 ml of 1% lidocaine.  She was prepped with betadine, Nexplanon removed from packaging,  Device confirmed in needle, then inserted full length of needle and withdrawn per handbook instructions. Nexplanon was able to palpated in the patient's arm; patient palpated the insert herself. There was minimal blood loss.  Patient insertion site covered with guaze and a pressure bandage to reduce any bruising.  The patient tolerated the procedure well and was given post procedure instructions.  Venora Maples, MD/[facility] Family [facility], [facility] for [employer], [facility]

## 2021-01-06 DIAGNOSIS — R55 Syncope and collapse: Secondary | ICD-10-CM | POA: Diagnosis not present

## 2021-01-07 DIAGNOSIS — G40219 Localization-related (focal) (partial) symptomatic epilepsy and epileptic syndromes with complex partial seizures, intractable, without status epilepticus: Secondary | ICD-10-CM | POA: Diagnosis not present

## 2021-01-07 DIAGNOSIS — R9082 White matter disease, unspecified: Secondary | ICD-10-CM | POA: Diagnosis not present

## 2021-01-07 DIAGNOSIS — D496 Neoplasm of unspecified behavior of brain: Secondary | ICD-10-CM | POA: Diagnosis not present

## 2021-01-08 DIAGNOSIS — I10 Essential (primary) hypertension: Secondary | ICD-10-CM | POA: Diagnosis not present

## 2021-01-08 DIAGNOSIS — T50904A Poisoning by unspecified drugs, medicaments and biological substances, undetermined, initial encounter: Secondary | ICD-10-CM | POA: Diagnosis not present

## 2021-01-08 DIAGNOSIS — T40411A Poisoning by fentanyl or fentanyl analogs, accidental (unintentional), initial encounter: Secondary | ICD-10-CM | POA: Diagnosis not present

## 2021-01-08 DIAGNOSIS — T887XXA Unspecified adverse effect of drug or medicament, initial encounter: Secondary | ICD-10-CM | POA: Diagnosis not present

## 2021-01-08 DIAGNOSIS — R531 Weakness: Secondary | ICD-10-CM | POA: Diagnosis not present

## 2021-01-08 DIAGNOSIS — R4020 Unspecified coma: Secondary | ICD-10-CM | POA: Diagnosis not present

## 2021-01-12 DIAGNOSIS — G40219 Localization-related (focal) (partial) symptomatic epilepsy and epileptic syndromes with complex partial seizures, intractable, without status epilepticus: Secondary | ICD-10-CM | POA: Diagnosis not present

## 2021-01-12 DIAGNOSIS — R9082 White matter disease, unspecified: Secondary | ICD-10-CM | POA: Diagnosis not present

## 2021-01-15 ENCOUNTER — Other Ambulatory Visit: Payer: Self-pay | Admitting: Student

## 2021-01-19 ENCOUNTER — Ambulatory Visit: Payer: Medicaid Other

## 2021-01-27 ENCOUNTER — Telehealth: Payer: Self-pay

## 2021-01-27 ENCOUNTER — Encounter: Payer: Self-pay | Admitting: Family Medicine

## 2021-01-27 NOTE — Telephone Encounter (Signed)
Called pt in regards to her MyChart message.  Pt is frustrated as she feels that the MyChart message seemed as if she was being brushed off.  I apologized to the pt that it was not the intent and is also the reason why I am calling.  Pt states that "you should know why I am taking pregnancy tests as I had an appt today scheduled but I took an at home pregnancy test that was negative."  I informed pt that I understand why she is doing the pregnancy tests now as I did not see that in her chart.  Pt states that she is having issues with her bleeding "that is my real concern".  Pt then requests to get a call back as she was not in a good head space because she has anxiety and has not taken her medication and feels frustrated.  I informed pt that I would leave a message for our office to give a call back tomorrow.  Pt verbalized understanding.   Leonette Nutting  01/27/21

## 2021-01-28 NOTE — Telephone Encounter (Addendum)
I called pt and she did not answer.  I left a message on her personal voicemail stating that I am following up on the call she had with Jeanetta yesterday in order to further discuss her bleeding. I stated that Dr. Crissie Reese is in the office this morning and asked her to call back to the nurse voicemail by 11:30 today or send a Mychart message with an update so that I can provide her with some guidance.   1/16  1458  Called pt and she did not answer. I left a message on her personal voicemail stating that I am attempting to reach  her in order to discuss her concerns about bleeding. Hopefully things have improved.  If she still has questions or would like to speak with a nurse, she can leave a message on the nurse voicemail or she can send [place] a Mychart message.

## 2021-02-07 NOTE — Telephone Encounter (Signed)
Call placed to pt since left VM on nurse line. Spoke with pt. Pt states having continued vaginal bleeding since delivery in Oct. Pt had Nexplanon placed on 01/04/21.  Pt states is having dark brown to dark red bleeding irreguarly. Pt advised this is normal after Nexplanon insertion and can last up to 3-6 months. Pt only changing light tampon 4 times a day for hygiene, not soaking any pads/underwear/tampons, denies abd pain or cramps.  Pt advised to continue to monitor and will inform Dr Crissie Reese about call. Will return call to pt if Dr Crissie Reese has any further recommendations. Pt verbalized understanding. Pt advised to continue to monitor bleeding and if has more concerns, then call and make appt to see provider. Pt agreeable to plan of care.  Laney Pastor

## 2021-02-11 DIAGNOSIS — R55 Syncope and collapse: Secondary | ICD-10-CM | POA: Diagnosis not present

## 2021-04-07 DIAGNOSIS — R55 Syncope and collapse: Secondary | ICD-10-CM | POA: Diagnosis not present

## 2021-04-22 DIAGNOSIS — M50323 Other cervical disc degeneration at C6-C7 level: Secondary | ICD-10-CM | POA: Diagnosis not present

## 2021-04-22 DIAGNOSIS — M50322 Other cervical disc degeneration at C5-C6 level: Secondary | ICD-10-CM | POA: Diagnosis not present

## 2021-04-28 DIAGNOSIS — G35 Multiple sclerosis: Secondary | ICD-10-CM | POA: Diagnosis not present

## 2021-04-28 DIAGNOSIS — G40219 Localization-related (focal) (partial) symptomatic epilepsy and epileptic syndromes with complex partial seizures, intractable, without status epilepticus: Secondary | ICD-10-CM | POA: Diagnosis not present

## 2021-04-28 DIAGNOSIS — F32A Depression, unspecified: Secondary | ICD-10-CM | POA: Diagnosis not present

## 2021-04-28 DIAGNOSIS — M542 Cervicalgia: Secondary | ICD-10-CM | POA: Diagnosis not present

## 2021-05-06 NOTE — BH Specialist Note (Signed)
Pt did not arrive to video visit and did not answer the phone; Left HIPPA-compliant message to call back Sheneka Schrom from [facility] at  [phone] (Alegria Dominique's office).  ?; left MyChart message for patient.  ? ?

## 2021-05-10 DIAGNOSIS — R55 Syncope and collapse: Secondary | ICD-10-CM | POA: Diagnosis not present

## 2021-05-13 ENCOUNTER — Ambulatory Visit: Payer: Medicaid Other | Admitting: Clinical

## 2021-05-13 DIAGNOSIS — Z91199 Patient's noncompliance with other medical treatment and regimen due to unspecified reason: Secondary | ICD-10-CM

## 2021-06-16 DIAGNOSIS — R55 Syncope and collapse: Secondary | ICD-10-CM | POA: Diagnosis not present

## 2021-07-18 DIAGNOSIS — R55 Syncope and collapse: Secondary | ICD-10-CM | POA: Diagnosis not present

## 2022-01-12 DIAGNOSIS — R55 Syncope and collapse: Secondary | ICD-10-CM | POA: Diagnosis not present

## 2022-02-20 DIAGNOSIS — R55 Syncope and collapse: Secondary | ICD-10-CM | POA: Diagnosis not present

## 2022-03-28 DIAGNOSIS — R55 Syncope and collapse: Secondary | ICD-10-CM | POA: Diagnosis not present

## 2022-05-05 DIAGNOSIS — R55 Syncope and collapse: Secondary | ICD-10-CM | POA: Diagnosis not present
